# Patient Record
Sex: Male | Born: 1965 | Race: Black or African American | Hispanic: No | Marital: Single | Smoking: Current every day smoker
Health system: Southern US, Community
[De-identification: ages and names within clinical notes are randomized; demographics above are authoritative.]

## PROBLEM LIST (undated history)

## (undated) DIAGNOSIS — T7840XA Allergy, unspecified, initial encounter: Secondary | ICD-10-CM

## (undated) DIAGNOSIS — R42 Dizziness and giddiness: Secondary | ICD-10-CM

## (undated) DIAGNOSIS — I10 Essential (primary) hypertension: Secondary | ICD-10-CM

## (undated) DIAGNOSIS — N400 Enlarged prostate without lower urinary tract symptoms: Secondary | ICD-10-CM

## (undated) HISTORY — DX: Allergy, unspecified, initial encounter: T78.40XA

## (undated) HISTORY — DX: Dizziness and giddiness: R42

---

## 1999-06-30 ENCOUNTER — Emergency Department (HOSPITAL_COMMUNITY): Admission: EM | Admit: 1999-06-30 | Discharge: 1999-06-30 | Payer: Self-pay | Admitting: Emergency Medicine

## 2003-03-10 ENCOUNTER — Emergency Department (HOSPITAL_COMMUNITY): Admission: EM | Admit: 2003-03-10 | Discharge: 2003-03-10 | Payer: Self-pay | Admitting: Emergency Medicine

## 2003-08-16 ENCOUNTER — Emergency Department (HOSPITAL_COMMUNITY): Admission: AD | Admit: 2003-08-16 | Discharge: 2003-08-16 | Payer: Self-pay | Admitting: Family Medicine

## 2004-09-04 ENCOUNTER — Emergency Department (HOSPITAL_COMMUNITY): Admission: EM | Admit: 2004-09-04 | Discharge: 2004-09-05 | Payer: Self-pay | Admitting: Emergency Medicine

## 2005-02-08 ENCOUNTER — Emergency Department (HOSPITAL_COMMUNITY): Admission: EM | Admit: 2005-02-08 | Discharge: 2005-02-08 | Payer: Self-pay | Admitting: Emergency Medicine

## 2006-02-26 ENCOUNTER — Emergency Department (HOSPITAL_COMMUNITY): Admission: EM | Admit: 2006-02-26 | Discharge: 2006-02-26 | Payer: Self-pay | Admitting: *Deleted

## 2006-05-22 ENCOUNTER — Emergency Department (HOSPITAL_COMMUNITY): Admission: EM | Admit: 2006-05-22 | Discharge: 2006-05-22 | Payer: Self-pay | Admitting: Emergency Medicine

## 2006-05-24 ENCOUNTER — Emergency Department (HOSPITAL_COMMUNITY): Admission: EM | Admit: 2006-05-24 | Discharge: 2006-05-24 | Payer: Self-pay | Admitting: Emergency Medicine

## 2009-12-13 ENCOUNTER — Emergency Department (HOSPITAL_COMMUNITY): Admission: EM | Admit: 2009-12-13 | Discharge: 2009-12-13 | Payer: Self-pay | Admitting: Emergency Medicine

## 2012-03-18 ENCOUNTER — Ambulatory Visit: Payer: Self-pay | Admitting: Family Medicine

## 2012-03-18 VITALS — BP 152/90 | HR 91 | Temp 98.2°F | Resp 16 | Ht 74.5 in | Wt 200.4 lb

## 2012-03-18 DIAGNOSIS — J329 Chronic sinusitis, unspecified: Secondary | ICD-10-CM

## 2012-03-18 DIAGNOSIS — I10 Essential (primary) hypertension: Secondary | ICD-10-CM

## 2012-03-18 DIAGNOSIS — K089 Disorder of teeth and supporting structures, unspecified: Secondary | ICD-10-CM | POA: Insufficient documentation

## 2012-03-18 DIAGNOSIS — J0100 Acute maxillary sinusitis, unspecified: Secondary | ICD-10-CM

## 2012-03-18 MED ORDER — FLUTICASONE PROPIONATE 50 MCG/ACT NA SUSP: 2.0000 | Freq: Every day | NASAL | Status: DC

## 2012-03-18 MED ORDER — CHLORTHALIDONE 25 MG PO TABS: 25.0000 mg | ORAL_TABLET | Freq: Every day | ORAL | Status: DC

## 2012-03-18 MED ORDER — CHLORHEXIDINE GLUCONATE 0.12 % MT SOLN: 15.0000 mL | Freq: Two times a day (BID) | OROMUCOSAL | Status: AC

## 2012-03-18 MED ORDER — AMOXICILLIN 875 MG PO TABS: 875.0000 mg | ORAL_TABLET | Freq: Two times a day (BID) | ORAL | Status: AC

## 2012-03-18 MED ORDER — TRAMADOL HCL 50 MG PO TABS: ORAL_TABLET | ORAL | Status: DC

## 2012-03-18 NOTE — Progress Notes (Signed)
  Subjective:    Patient ID: John Woods, male    DOB: 19-Apr-1966, 46 y.o.   MRN: [id number]  HPI  Patient complains of runny nose, pnd and cough since pollen counts have been up. Now with facial and dental pain.  Dental disease; delinquent dental care  Tobacco 1 pack per day  Review of Systems     Objective:   Physical Exam  Constitutional: He appears well-developed.  HENT:       Nares inflamed, purulent discharge Extensive peridontal disease  Neck: Neck supple.  Cardiovascular: Normal rate, regular rhythm and normal heart sounds.   Pulmonary/Chest: Effort normal and breath sounds normal.  Abdominal: Soft. Bowel sounds are normal.  Neurological: He is alert.  Skin: Skin is warm.          Assessment & Plan:   1. Sinusitis  amoxicillin (AMOXIL) 875 MG tablet, fluticasone (FLONASE) 50 MCG/ACT nasal spray  2. Dental disease  chlorhexidine (PERIDEX) 0.12 % solution, traMADol (ULTRAM) 50 MG tablet  3. HTN (hypertension)  chlorthalidone (HYGROTON) 25 MG tablet

## 2012-03-20 ENCOUNTER — Telehealth: Payer: Self-pay

## 2012-03-20 NOTE — Telephone Encounter (Signed)
PT STATES HE WAS GIVEN ABOUT 5 DIFFERENT MEDICINE AND SOME IS MAKING HIM NAUSEA AND ALSO SUPPRESSING HIS APPETITE  PLEASE CALL [phone]

## 2012-03-20 NOTE — Telephone Encounter (Signed)
PT STATES MEDS ARE MAKING HIM DIZZY AND NAUSEATED AND HE HAS SUPPRESSED APPETITE. IS THERE ANYTHING THAT CAN BE DONE?

## 2012-03-20 NOTE — Telephone Encounter (Signed)
Of the meds he received, the tramadol is the most likely to cause dizziness and nausea.  Stop that one first.  The chlorthalidone can also cause dizziness if it brings his BP down more than expected, so he should consider checking his BP to see what it is.  Additionally, his illness can cause nausea and dizziness, so if the symptoms continue, he should RTC for re-evaluation.

## 2012-03-21 NOTE — Telephone Encounter (Signed)
LMOM to call back

## 2012-03-21 NOTE — Telephone Encounter (Signed)
Spoke with patient and let him know that he could stop the tramadol and keep an eye on his blood pressure.  If symptoms continue, rtc.  Patient stated that he understood.

## 2012-05-03 ENCOUNTER — Other Ambulatory Visit: Payer: Self-pay | Admitting: Family Medicine

## 2012-06-09 ENCOUNTER — Other Ambulatory Visit: Payer: Self-pay | Admitting: *Deleted

## 2012-06-09 MED ORDER — CHLORTHALIDONE 25 MG PO TABS: 25.0000 mg | ORAL_TABLET | Freq: Every day | ORAL | Status: DC

## 2012-07-19 ENCOUNTER — Other Ambulatory Visit: Payer: Self-pay

## 2012-07-19 MED ORDER — CHLORTHALIDONE 25 MG PO TABS: 25.0000 mg | ORAL_TABLET | Freq: Every day | ORAL | Status: DC

## 2013-03-12 ENCOUNTER — Ambulatory Visit: Payer: Self-pay | Admitting: Internal Medicine

## 2013-03-12 VITALS — BP 152/98 | HR 79 | Temp 97.9°F | Resp 18 | Wt 181.0 lb

## 2013-03-12 DIAGNOSIS — J301 Allergic rhinitis due to pollen: Secondary | ICD-10-CM

## 2013-03-12 DIAGNOSIS — I10 Essential (primary) hypertension: Secondary | ICD-10-CM

## 2013-03-12 DIAGNOSIS — F17200 Nicotine dependence, unspecified, uncomplicated: Secondary | ICD-10-CM | POA: Insufficient documentation

## 2013-03-12 DIAGNOSIS — J329 Chronic sinusitis, unspecified: Secondary | ICD-10-CM

## 2013-03-12 DIAGNOSIS — J309 Allergic rhinitis, unspecified: Secondary | ICD-10-CM

## 2013-03-12 MED ORDER — FLUTICASONE PROPIONATE 50 MCG/ACT NA SUSP: 2.0000 | Freq: Every day | NASAL | Status: DC

## 2013-03-12 MED ORDER — CETIRIZINE HCL 10 MG PO TABS: 10.0000 mg | ORAL_TABLET | Freq: Every day | ORAL | Status: DC

## 2013-03-12 NOTE — Patient Instructions (Signed)
Hypertension As your heart beats, it forces blood through your arteries. This force is your blood pressure. If the pressure is too high, it is called hypertension (HTN) or high blood pressure. HTN is dangerous because you may have it and not know it. High blood pressure may mean that your heart has to work harder to pump blood. Your arteries may be narrow or stiff. The extra work puts you at risk for heart disease, stroke, and other problems.  Blood pressure consists of two numbers, a higher number over a lower, 110/72, for example. It is stated as "110 over 72." The ideal is below 120 for the top number (systolic) and under 80 for the bottom (diastolic). Write down your blood pressure today. You should pay close attention to your blood pressure if you have certain conditions such as:  Heart failure.  Prior heart attack.  Diabetes  Chronic kidney disease.  Prior stroke.  Multiple risk factors for heart disease. To see if you have HTN, your blood pressure should be measured while you are seated with your arm held at the level of the heart. It should be measured at least twice. A one-time elevated blood pressure reading (especially in the Emergency Department) does not mean that you need treatment. There may be conditions in which the blood pressure is different between your right and left arms. It is important to see your caregiver soon for a recheck. Most people have essential hypertension which means that there is not a specific cause. This type of high blood pressure may be lowered by changing lifestyle factors such as:  Stress.  Smoking.  Lack of exercise.  Excessive weight.  Drug/tobacco/alcohol use.  Eating less salt. Most people do not have symptoms from high blood pressure until it has caused damage to the body. Effective treatment can often prevent, delay or reduce that damage. TREATMENT  When a cause has been identified, treatment for high blood pressure is directed at the  cause. There are a large number of medications to treat HTN. These fall into several categories, and your caregiver will help you select the medicines that are best for you. Medications may have side effects. You should review side effects with your caregiver. If your blood pressure stays high after you have made lifestyle changes or started on medicines,   Your medication(s) may need to be changed.  Other problems may need to be addressed.  Be certain you understand your prescriptions, and know how and when to take your medicine.  Be sure to follow up with your caregiver within the time frame advised (usually within two weeks) to have your blood pressure rechecked and to review your medications.  If you are taking more than one medicine to lower your blood pressure, make sure you know how and at what times they should be taken. Taking two medicines at the same time can result in blood pressure that is too low. SEEK IMMEDIATE MEDICAL CARE IF:  You develop a severe headache, blurred or changing vision, or confusion.  You have unusual weakness or numbness, or a faint feeling.  You have severe chest or abdominal pain, vomiting, or breathing problems. MAKE SURE YOU:   Understand these instructions.  Will watch your condition.  Will get help right away if you are not doing well or get worse. Document Released: 10/10/2005 Document Revised: 01/02/2012 Document Reviewed: 05/30/2008 [employer] Patient Information 2013 [employer]. DASH Diet The DASH diet stands for "Dietary Approaches to Stop Hypertension." It is a healthy   eating plan that has been shown to reduce high blood pressure (hypertension) in as little as 14 days, while also possibly providing other significant health benefits. These other health benefits include reducing the risk of breast cancer after menopause and reducing the risk of type 2 diabetes, heart disease, colon cancer, and stroke. Health benefits also include weight loss  and slowing kidney failure in patients with chronic kidney disease.  DIET GUIDELINES  Limit salt (sodium). Your diet should contain less than 1500 mg of sodium daily.  Limit refined or processed carbohydrates. Your diet should include mostly whole grains. Desserts and added sugars should be used sparingly.  Include small amounts of heart-healthy fats. These types of fats include nuts, oils, and tub margarine. Limit saturated and trans fats. These fats have been shown to be harmful in the body. CHOOSING FOODS  The following food groups are based on a 2000 calorie diet. See your Registered Dietitian for individual calorie needs. Grains and Grain Products (6 to 8 servings daily)  Eat More Often: Whole-wheat bread, brown rice, whole-grain or wheat pasta, quinoa, popcorn without added fat or salt (air popped).  Eat Less Often: White bread, white pasta, white rice, cornbread. Vegetables (4 to 5 servings daily)  Eat More Often: Fresh, frozen, and canned vegetables. Vegetables may be raw, steamed, roasted, or grilled with a minimal amount of fat.  Eat Less Often/Avoid: Creamed or fried vegetables. Vegetables in a cheese sauce. Fruit (4 to 5 servings daily)  Eat More Often: All fresh, canned (in natural juice), or frozen fruits. Dried fruits without added sugar. One hundred percent fruit juice ( cup [237 mL] daily).  Eat Less Often: Dried fruits with added sugar. Canned fruit in light or heavy syrup. [employer], Fish, and Poultry (2 servings or less daily. One serving is 3 to 4 oz [85-114 g]).  Eat More Often: Ninety percent or leaner ground beef, tenderloin, sirloin. Round cuts of beef, chicken breast, [place] breast. All fish. Grill, bake, or broil your meat. Nothing should be fried.  Eat Less Often/Avoid: Fatty cuts of meat, [place], or chicken leg, thigh, or wing. Fried cuts of meat or fish. Dairy (2 to 3 servings)  Eat More Often: Low-fat or fat-free milk, low-fat plain or light yogurt,  reduced-fat or part-skim cheese.  Eat Less Often/Avoid: Milk (whole, 2%).Whole milk yogurt. Full-fat cheeses. Nuts, Seeds, and Legumes (4 to 5 servings per week)  Eat More Often: All without added salt.  Eat Less Often/Avoid: Salted nuts and seeds, canned beans with added salt. Fats and Sweets (limited)  Eat More Often: Vegetable oils, tub margarines without trans fats, sugar-free gelatin. Mayonnaise and salad dressings.  Eat Less Often/Avoid: Coconut oils, palm oils, butter, stick margarine, cream, half and half, cookies, candy, pie. FOR MORE INFORMATION The Dash Diet Eating Plan: www.dashdiet.org Document Released: 09/29/2011 Document Revised: 01/02/2012 Document Reviewed: 09/29/2011 [facility] Patient Information 2013 [place], [place]. Nicotine chewing gum What is this medicine? NICOTINE (NIK oh teen) helps people stop smoking. This medicine replaces the nicotine found in cigarettes and helps to decrease withdrawal effects. It is most effective when used in combination with a stop-smoking program. This medicine may be used for other purposes; ask your health care provider or pharmacist if you have questions. What should I tell my health care provider before I take this medicine? They need to know if you have any of these conditions: -diabetes -heart disease, angina, irregular heartbeat or previous heart attack -lung disease, including asthma -overactive thyroid -pheochromocytoma -stomach problems  or ulcers -an unusual or allergic reaction to nicotine, other medicines, foods, dyes, or preservatives -pregnant or trying to get pregnant -breast-feeding How should I use this medicine? Chew but do not swallow the gum. Follow the directions that come with the chewing gum. Use exactly as directed. When you feel an urgent desire for a cigarette, chew one piece of gum slowly. Continue chewing until you taste the gum or feel a slight tingling in your mouth. Then, stop chewing and place the gum  between your cheek and gum. Wait until the taste or tingling is almost gone then start chewing again. Continue chewing in this manner for about 30 minutes. Slow chewing helps reduce cravings and also helps reduce the chance for heartburn or other gastrointestinal side effects. Talk to your pediatrician regarding the use of this medicine in children. Special care may be needed. Overdosage: If you think you have taken too much of this medicine contact a poison control center or emergency room at once. NOTE: This medicine is only for you. Do not share this medicine with others. What if I miss a dose? This does not apply. Only use the chewing gum when you have a strong desire to smoke. Do not use more than one piece of gum at a time. What may interact with this medicine? -medicines for asthma -medicines for blood pressure -medicines for mental depression This list may not describe all possible interactions. Give your health care provider a list of all the medicines, herbs, non-prescription drugs, or dietary supplements you use. Also tell them if you smoke, drink alcohol, or use illegal drugs. Some items may interact with your medicine. What should I watch for while using this medicine? Always carry the nicotine gum with you. Do not smoke while you are using the chewing gum. Do not use more than 30 pieces of gum a day. Too much gum can increase the risk of an overdose. As the urge to smoke gets less, gradually reduce the number of pieces each day over a period of 2 to 3 months. When you are only using 1 or 2 pieces a day, stop using the nicotine gum. If your mouth gets sore from chewing the gum, suck hard sugarless candy between pieces of gum to help relieve the soreness. Brush your teeth regularly to reduce mouth irritation. If you wear dentures, contact your doctor or health care professional if the gum sticks to your dental work. If you are a diabetic and you quit smoking, the effects of insulin may be  increased and you may need to reduce your insulin dose. Check with your doctor or health care professional about how you should adjust your insulin dose. What side effects may I notice from receiving this medicine? Side effects that you should report to your doctor or health care professional as soon as possible: -allergic reactions like skin rash, itching or hives, swelling of the face, lips, or tongue -blisters in mouth -breathing problems -changes in hearing -changes in vision -chest pain -cold sweats -confusion -fast, irregular heartbeat -feeling faint or lightheaded, falls -headache -increased saliva -nausea, vomiting -stomach pain -weakness Side effects that usually do not require medical attention (report to your doctor or health care professional if they continue or are bothersome): -diarrhea -dry mouth -hiccups -irritability -nervousness or restlessness -trouble sleeping or vivid dreams This list may not describe all possible side effects. Call your doctor for medical advice about side effects. You may report side effects to FDA at 1-800-FDA-1088. Where should I keep  my medicine? Keep out of the reach of children. Store at room temperature between 15 and 30 degrees C (59 and 86 degrees F). Protect from heat and light. Throw away unused medicine after the expiration date. NOTE: This sheet is a summary. It may not cover all possible information. If you have questions about this medicine, talk to your doctor, pharmacist, or health care provider.  2012, Elsevier/Gold Standard. (12/14/2010 1:00:52 PM)Nicotine Addiction Nicotine can act as both a stimulant (excites/activates) and a sedative (calms/quiets). Immediately after exposure to nicotine, there is a "kick" caused in part by the drug's stimulation of the adrenal glands and resulting discharge of adrenaline (epinephrine). The rush of adrenaline stimulates the body and causes a sudden release of sugar. This means that smokers  are always slightly hyperglycemic. Hyperglycemic means that the blood sugar is high, just like in diabetics. Nicotine also decreases the amount of insulin which helps control sugar levels in the body. There is an increase in blood pressure, breathing, and the rate of heart beats.  In addition, nicotine indirectly causes a release of dopamine in the brain that controls pleasure and motivation. A similar reaction is seen with other drugs of abuse, such as cocaine and heroin. This dopamine release is thought to cause the pleasurable sensations when smoking. In some different cases, nicotine can also create a calming effect, depending on sensitivity of the smoker's nervous system and the dose of nicotine taken. WHAT HAPPENS WHEN NICOTINE IS TAKEN FOR LONG PERIODS OF TIME?  Long-term use of nicotine results in addiction. It is difficult to stop.  Repeated use of nicotine creates tolerance. Higher doses of nicotine are needed to get the "kick." When nicotine use is stopped, withdrawal may last a month or more. Withdrawal may begin within a few hours after the last cigarette. Symptoms peak within the first few days and may lessen within a few weeks. For some people, however, symptoms may last for months or longer. Withdrawal symptoms include:   Irritability.  Craving.  Learning and attention deficits.  Sleep disturbances.  Increased appetite. Craving for tobacco may last for 6 months or longer. Many behaviors done while using nicotine can also play a part in the severity of withdrawal symptoms. For some people, the feel, smell, and sight of a cigarette and the ritual of obtaining, handling, lighting, and smoking the cigarette are closely linked with the pleasure of smoking. When stopped, they also miss the related behaviors which make the withdrawal or craving worse. While nicotine gum and patches may lessen the drug aspects of withdrawal, cravings often persist. WHAT ARE THE MEDICAL CONSEQUENCES OF  NICOTINE USE?  Nicotine addiction accounts for one-third of all cancers. The top cancer caused by tobacco is lung cancer. Lung cancer is the number one cancer killer of both men and women.  Smoking is also associated with cancers of the:  Mouth.  Pharynx.  Larynx.  Esophagus.  Stomach.  Pancreas.  Cervix.  Kidney.  Ureter.  Bladder.  Smoking also causes lung diseases such as lasting (chronic) bronchitis and emphysema.  It worsens asthma in adults and children.  Smoking increases the risk of heart disease, including:  Stroke.  Heart attack.  Vascular disease.  Aneurysm.  Passive or secondary smoke can also increase medical risks including:  Asthma in children.  Sudden Infant Death Syndrome (SIDS).  Additionally, dropped cigarettes are the leading cause of residential fire fatalities.  Nicotine poisoning has been reported from accidental ingestion of tobacco products by children and pets. Death  usually results in a few minutes from respiratory failure (when a person stops breathing) caused by paralysis. TREATMENT   Medication. Nicotine replacement medicines such as nicotine gum and the patch are used to stop smoking. These medicines gradually lower the dosage of nicotine in the body. These medicines do not contain the carbon monoxide and other toxins found in tobacco smoke.  Hypnotherapy.  Relaxation therapy.  Nicotine Anonymous (a 12-step support program). Find times and locations in your local yellow pages. Document Released: 06/15/2004 Document Revised: 01/02/2012 Document Reviewed: 11/07/2007 [facility] Patient Information 2013 [place], [place].

## 2013-03-12 NOTE — Progress Notes (Signed)
  Subjective:    Patient ID: John Woods, male    DOB: 01-06-1966, 47 y.o.   MRN: [id number]  HPI Has chronic congestion year round, has probable allergys, but has not tried fluticasone yet.  Continues to smoke! Must quit , offered options. HTN may be an issue but unclear with smoking and otc sinus meds.   Review of Systems     Objective:   Physical Exam  Vitals reviewed. Constitutional: He is oriented to person, place, and time. He appears well-developed and well-nourished.  HENT:  Right Ear: External ear normal.  Left Ear: External ear normal.  Nose: Mucosal edema and rhinorrhea present. No sinus tenderness. No epistaxis.  Mouth/Throat: Oropharynx is clear and moist.  Eyes: EOM are normal.  Cardiovascular: Normal rate, regular rhythm and normal heart sounds.   Pulmonary/Chest: Effort normal and breath sounds normal. He has no wheezes. He exhibits no tenderness.  Neurological: He is alert and oriented to person, place, and time. He has normal reflexes. He exhibits normal muscle tone. Coordination normal.  Psychiatric: He has a normal mood and affect.   BP 130/86       Assessment & Plan:  Hayfever/Nicotine abuse/BP elevation/Counseling Record home BP/Quit smoking--nicorrete Life style change

## 2013-10-30 ENCOUNTER — Ambulatory Visit: Payer: Self-pay | Admitting: Family Medicine

## 2013-10-30 VITALS — BP 118/86 | HR 68 | Temp 98.1°F | Resp 16 | Ht 75.0 in | Wt 184.0 lb

## 2013-10-30 DIAGNOSIS — H109 Unspecified conjunctivitis: Secondary | ICD-10-CM

## 2013-10-30 MED ORDER — PREDNISOLONE ACETATE 1 % OP SUSP: 1.0000 [drp] | Freq: Two times a day (BID) | OPHTHALMIC | Status: DC

## 2013-10-30 MED ORDER — TOBRAMYCIN 0.3 % OP SOLN: 1.0000 [drp] | Freq: Two times a day (BID) | OPHTHALMIC | Status: DC

## 2013-10-30 NOTE — Progress Notes (Signed)
Subjective:  This chart was scribed for Elvina SidleKurt Lauenstein, MD by Carl Bestelina Holson, Medical Scribe. This patient was seen in Room 9 and the patient's care was started at 5:36 PM.   Patient ID: John AmsterdamLee A Quant, male    DOB: 11/27/1965, 48 y.o.   MRN: [id number]  HPI HPI Comments: John Woods is a 48 y.o. male who presents to the [facility] complaining of constant, worsening eye and sinus drainage.  He states that the eye drainage started about a month ago but it has been worsening over the past two days.  He states that the sinus drainage started a week ago.  He lists blurred vision and intermittent right eye pain as associated symptoms.  He states that closing his eye improves his vision.  He states that he has applied eye drops to his right eye with no relief in his symptoms.  He states that the grease at work causes eye irritation.  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  Past Medical History  Diagnosis Date   Allergy    History reviewed. No pertinent past surgical history. Family History  Problem Relation Age of Onset   Cancer Mother    Cancer Father    History   Social History   Marital Status: Single    Spouse Name: N/A    Number of Children: N/A   Years of Education: N/A   Occupational History   Not on file.   Social History Main Topics   Smoking status: Current Every Day Smoker   Smokeless tobacco: Not on file   Alcohol Use: No   Drug Use: No   Sexual Activity: Yes   Other Topics Concern   Not on file   Social History Narrative   No narrative on file   No Known Allergies  Review of Systems  Eyes: Positive for pain (right eye), discharge and visual disturbance.  All other systems reviewed and are negative.     Objective:  Physical Exam  Nursing note and vitals reviewed. Constitutional: He is oriented to person, place, and time. He appears well-developed and well-nourished.  HENT:  Head: Normocephalic and  atraumatic.  Eyes: EOM are normal. Pupils are equal, round, and reactive to light.  Neck: Normal range of motion and phonation normal.  Cardiovascular: Normal rate.   Pulmonary/Chest: Effort normal. He exhibits no bony tenderness.  Abdominal: Soft. Normal appearance.  Musculoskeletal: Normal range of motion.  Neurological: He is alert and oriented to person, place, and time. No cranial nerve deficit or sensory deficit. He exhibits normal muscle tone. Coordination normal.  Skin: Skin is warm, dry and intact.  Psychiatric: He has a normal mood and affect. His behavior is normal. Judgment and thought content normal.     BP 118/86   Pulse 68   Temp(Src) 98.1 F (36.7 C)   Resp 16   Ht 6\' 3"  (1.905 m)   Wt 184 lb (83.462 kg)   BMI 23.00 kg/m2   SpO2 99% Assessment & Plan:   Meds ordered this encounter  Medications   tobramycin (TOBREX) 0.3 % ophthalmic solution    Sig: Place 1 drop into the right eye every 12 (twelve) hours.    Dispense:  5 mL    Refill:  0   prednisoLONE acetate (PRED FORTE) 1 % ophthalmic suspension    Sig: Place 1 drop into the right eye 2 (two) times daily.    Dispense:  5 mL  Refill:  0     1. Conjunctivitis     I personally performed the services described in this documentation, which was scribed in my presence. The recorded information has been reviewed and is accurate.  Elvina Sidle, MD

## 2014-01-09 ENCOUNTER — Telehealth: Payer: Self-pay | Admitting: *Deleted

## 2014-01-09 NOTE — Telephone Encounter (Signed)
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 Per Dr. Milus GlazierLauenstein patient needs a office visit in order to have this filled out. AC

## 2015-12-25 ENCOUNTER — Encounter (HOSPITAL_COMMUNITY): Payer: Self-pay

## 2015-12-25 ENCOUNTER — Emergency Department (HOSPITAL_COMMUNITY)
Admission: EM | Admit: 2015-12-25 | Discharge: 2015-12-25 | Disposition: A | Discharge status: Home / Self Care | Payer: Self-pay | Attending: Emergency Medicine | Admitting: Emergency Medicine

## 2015-12-25 DIAGNOSIS — F17200 Nicotine dependence, unspecified, uncomplicated: Secondary | ICD-10-CM | POA: Insufficient documentation

## 2015-12-25 DIAGNOSIS — Z23 Encounter for immunization: Secondary | ICD-10-CM | POA: Insufficient documentation

## 2015-12-25 DIAGNOSIS — Z7952 Long term (current) use of systemic steroids: Secondary | ICD-10-CM | POA: Insufficient documentation

## 2015-12-25 DIAGNOSIS — L02811 Cutaneous abscess of head [any part, except face]: Secondary | ICD-10-CM | POA: Insufficient documentation

## 2015-12-25 DIAGNOSIS — L0291 Cutaneous abscess, unspecified: Secondary | ICD-10-CM

## 2015-12-25 DIAGNOSIS — Z792 Long term (current) use of antibiotics: Secondary | ICD-10-CM | POA: Insufficient documentation

## 2015-12-25 MED ORDER — CEPHALEXIN 500 MG PO CAPS: 500.0000 mg | ORAL_CAPSULE | Freq: Four times a day (QID) | ORAL | Status: DC

## 2015-12-25 MED ORDER — TETANUS-DIPHTH-ACELL PERTUSSIS 5-2.5-18.5 LF-MCG/0.5 IM SUSP
0.5000 mL | Freq: Once | INTRAMUSCULAR | Status: AC
  Administered 2015-12-25: 0.5 mL via INTRAMUSCULAR
  Filled 2015-12-25: qty 0.5

## 2015-12-25 MED ORDER — LIDOCAINE-EPINEPHRINE (PF) 2 %-1:200000 IJ SOLN: 10.0000 mL | Freq: Once | INTRAMUSCULAR | Status: DC

## 2015-12-25 MED ORDER — IBUPROFEN 800 MG PO TABS: 800.0000 mg | ORAL_TABLET | Freq: Three times a day (TID) | ORAL | Status: DC

## 2015-12-25 MED ORDER — LIDOCAINE-EPINEPHRINE 2 %-1:100000 IJ SOLN
INTRAMUSCULAR | Status: AC
  Filled 2015-12-25: qty 1

## 2015-12-25 NOTE — ED Provider Notes (Signed)
CSN: [id number]     Arrival date & time 12/25/15  1652 History  By signing my name below, I, Emmanuella Mensah, attest that this documentation has been prepared under the direction and in the presence of Shawn Joy, PA-C. Electronically Signed: Angelene GiovanniEmmanuella Mensah, ED Scribe. 12/25/2015. 5:37 PM.   Chief Complaint  Patient presents with  . Recurrent Skin Infections   The history is provided by the patient. No language interpreter was used.   HPI Comments: Abbe AmsterdamLee A Si is a 50 y.o. male with a hx of allergies who presents to the Emergency Department complaining of gradually worsening 8/10 throbbing area of swelling and redness to his posterior head the size of a ping pong ball onset several years ago, but growing and worsening the last few days. He compares his pain to a tooth ache. No alleviating factors noted. Pt has not tried any medications PTA for these symptoms. He denies any fever, chills, HA, dizziness, N/V, neuro deficits, or any other complaints.    Past Medical History  Diagnosis Date  . Allergy    History reviewed. No pertinent past surgical history. Family History  Problem Relation Age of Onset  . Cancer Mother   . Cancer Father    Social History  Substance Use Topics  . Smoking status: Current Every Day Smoker  . Smokeless tobacco: None  . Alcohol Use: No    Review of Systems  Constitutional: Negative for fever and chills.  Gastrointestinal: Negative for nausea and vomiting.  Skin:       Area of swelling and redness to his posterior head   Neurological: Negative for dizziness and headaches.      Allergies  Review of patient's allergies indicates no known allergies.  Home Medications   Prior to Admission medications   Medication Sig Start Date End Date Taking? Authorizing Provider  cephALEXin (KEFLEX) 500 MG capsule Take 1 capsule (500 mg total) by mouth 4 (four) times daily. 12/25/15   Shawn C Joy, PA-C  ibuprofen (ADVIL,MOTRIN) 800 MG tablet Take 1 tablet (800 mg  total) by mouth 3 (three) times daily. 12/25/15   Shawn C Joy, PA-C  prednisoLONE acetate (PRED FORTE) 1 % ophthalmic suspension Place 1 drop into the right eye 2 (two) times daily. 10/30/13   Elvina SidleKurt Lauenstein, MD  tobramycin (TOBREX) 0.3 % ophthalmic solution Place 1 drop into the right eye every 12 (twelve) hours. 10/30/13   Elvina SidleKurt Lauenstein, MD   BP 170/96 mmHg  Pulse 75  Temp(Src) 98.2 F (36.8 C) (Oral)  Resp 20  SpO2 100% Physical Exam  Constitutional: He is oriented to person, place, and time. He appears well-developed and well-nourished.  HENT:  Head: Normocephalic and atraumatic.  Eyes: Conjunctivae and EOM are normal. Pupils are equal, round, and reactive to light.  Neck: Normal range of motion. Neck supple.  Cardiovascular: Normal rate.   Pulmonary/Chest: Effort normal.  Abdominal: He exhibits no distension.  Musculoskeletal:  Full ROM in all extremities and spine. No paraspinal tenderness.   Lymphadenopathy:       Head (right side): No preauricular, no posterior auricular and no occipital adenopathy present.       Head (left side): No preauricular, no posterior auricular and no occipital adenopathy present.    He has no cervical adenopathy.  Neurological: He is alert and oriented to person, place, and time. He has normal reflexes.  No sensory deficits. Strength 5/5 in all extremities. No gait disturbance. Coordination intact. Cranial nerves III-XII grossly intact. No facial droop.  Skin: Skin is warm and dry. There is erythema.  Area of swelling, erythema, and fluctuance in central occipital region, the size of ping pong ball.   Psychiatric: He has a normal mood and affect.  Nursing note and vitals reviewed.   ED Course  .Marland KitchenIncision and Drainage Date/Time: 12/25/2015 5:46 PM Performed by: Anselm Pancoast Authorized by: Harolyn Rutherford C Consent: Verbal consent obtained. Risks and benefits: risks, benefits and alternatives were discussed Consent given by: patient Patient  understanding: patient states understanding of the procedure being performed Patient consent: the patient's understanding of the procedure matches consent given Procedure consent: procedure consent matches procedure scheduled Patient identity confirmed: verbally with patient and arm band Time out: Immediately prior to procedure a "time out" was called to verify the correct patient, procedure, equipment, support staff and site/side marked as required. Type: abscess Body area: head Location details: scalp Anesthesia: local infiltration Local anesthetic: lidocaine 2% with epinephrine Anesthetic total: 2 ml Patient sedated: no Scalpel size: 11 Incision type: single straight Incision depth: subcutaneous Complexity: simple Drainage: purulent and  bloody Drainage amount: copious Wound treatment: wound left open Packing material: none Patient tolerance: Patient tolerated the procedure well with no immediate complications Comments: Copious, foul-smelling discharge. Skull appears stable underneath.   (including critical care time) DIAGNOSTIC STUDIES: Oxygen Saturation is 100% on RA, normal by my interpretation.    COORDINATION OF CARE: 5:36 PM- Pt advised of plan for treatment and pt agrees. Pt will receive I&D with local anesthetic.  EMERGENCY DEPARTMENT US SOFT TISSUE INTERPRETATION "Study: Limited Ultrasound of the noted body part in comments below"  INDICATIONS: Pain and Soft tissue infection Multiple views of the body part are obtained with a multi-frequency linear probe  PERFORMED BY:  Myself  IMAGES ARCHIVED?: Yes  SIDE:Midline  BODY PART:Other soft tisse (comment in note)  FINDINGS: Abscess identified with no cellulitis.   LIMITATIONS: None  INTERPRETATION:  Abcess present and No cellulitis noted  COMMENT:  Body part: central occipital region.    Harolyn Rutherford, PA-C has personally reviewed and evaluated these images as part of his medical decision-making.   MDM    Final diagnoses:  Abscess    Lilia Pro Cuthbertson presents with an abscess to the back of his head worsening in the last few days.  Ultrasound showed area of drainable fluid collection. I&D was successful. No signs of cellulitis. Antibiotic and pain management prescriptions given. Home care and return precautions discussed. Patient voiced understanding of these instructions and is comfortable with discharge. Patient appears safe for discharge at this time.  I personally performed the services described in this documentation, which was scribed in my presence. The recorded information has been reviewed and is accurate.   Anselm Pancoast, PA-C 12/25/15 1807  Melene Plan, DO 12/25/15 2247

## 2015-12-25 NOTE — ED Notes (Signed)
Pt reports he has a cyst on the back of his head that he has noticed several years ago, has not been getting any better, would like to get it checked out.

## 2015-12-25 NOTE — Discharge Instructions (Signed)
You have been seen today for abscess. Return to the ED or go to the PCP office in 3 days for a wound check to assure proper healing. Return to ED should symptoms worsen.  RESOURCE GUIDE  Chronic Pain Problems: Contact [facility]  [phone] Patients need to be referred by their primary care doctor.  Insufficient Money for Medicine: Contact United Way:  call "211" or [facility] [phone].  No Primary Care Doctor: - Call [facility]  [phone] - can help you locate a primary care doctor that  accepts your insurance, provides certain services, etc. - Physician Referral Service- [phone]  Agencies that provide inexpensive medical care: - [facility]  [phone] - [facility]  [phone] - [facility]  [phone] - Women's Clinic  [phone] - Planned Parenthood  [phone] Haynes Bast[facility]  [phone]  Medicaid-accepting [facility] Providers: - [facility]- [address], [address]  [phone], Mon-Fri 9am-7pm, Sat 9am-1pm - [facility]- [address], [address] [place]  [phone] - [facility]- [address], [address] [place]  [phone] [facility]- [facility]- [address]  [phone] - Renaye RakersVeita Bland- [address], [address], [phone]  Only accepts [place] patients after they have their name  applied to their card  Self Pay (no insurance) in [place]: - Sickle Cell Patients: Dr Willey BladeEric Dean, [facility]  [address], [phone] - [facility]- [address]  [phone]       Redge Gainer-     [facility] [place]- [address], [address]       -     [facility]- see information above (Speak to CitigroupPam H if you do not have insurance)       -  [facility]- [address], [phone]       -  [facility]- [address],  [phone]        -  [facility]- [address], [phone]       -  Dr Julio Sickssei-Bonsu-  [address], [address], [place], [phone]       -  [facility]- [address], [phone]       -  [facility]- [address], [phone], also [address], [phone]       -    [facility]- [address], [phone], 1st & 3rd Saturday   every month, 10am-1pm  1) Find a Doctor and Pay Out of Pocket Although you won't have to find out who is covered by your insurance plan, it is a good idea to ask around and get recommendations. You will then need to call the office and see if the doctor you have chosen will accept you as a new patient and what types of options they offer for patients who are self-pay. Some doctors offer discounts or will set up payment plans for their patients who do not have insurance, but you will need to ask so you aren't surprised when you get to your appointment.  2) Contact Your Local Health Department Not all health departments have doctors that can see patients for sick visits, but many do, so it is worth a call to see if yours does. If you  don't know where your local health department is, you can check in your phone book. The CDC also has a tool to help you locate your state's health department, and many state websites also have listings of all of their local health departments.  3) Find a Walk-in Clinic If your illness is not likely to be very severe or complicated, you may want to try a walk in clinic. These are popping up all over the country in pharmacies, drugstores, and shopping centers. They're usually staffed by nurse practitioners or physician assistants that have been trained to treat common illnesses and complaints. They're usually fairly quick and inexpensive. However, if you have serious medical issues or chronic medical problems, these are probably not your best option  STD Testing - [facility] Department of  [facility] [place], STD Clinic, [address], [place], phone [phone] or [phone].  Monday - Friday, call for an appointment. [facility] Department of [employer], STD Clinic, [address], [place], phone [phone] or [phone].  Monday - Friday, call for an appointment.  Abuse/Neglect: [facility] Child Abuse Hotline [phone] [facility] Child Abuse Hotline (929)761-9515 (After Hours)  Emergency Shelter:  [facility] 910-273-7835  Maternity Homes: - [facility] at the [facility] [facility] [phone] - [facility] 2540245641  MRSA Hotline #:   (514)641-6353  [facility] Resources  [facility]  United [employer] [facility] Dept. [address]                 [address]         [address] [place] [address]  Blondell Reveal Phone:  [phone]                                  Phone:  [phone]                   Phone:  [phone]  [facility] Mental [facility], [phone] - [phone]       -     [facility] in [place], [address],                                  70[address], [facility] Child Abuse Hotline 757-589-5349 or [phone] (After Hours)   Behavioral Health Services  Substance Abuse Resources: - [facility] Drug [facility]  [phone] - [facility] [phone] - [facility] [phone] Floydene Flock [phone] - Residential & Outpatient Substance Abuse Program  [phone]  Psychological Services: [facility]  [phone] Services  [phone] - [facility], [phone] [place]. [address], [place], ACCESS LINE: [phone] or [phone],  [employer]ur[employer]an.co.za  Dental Assistance  If unable to pay or uninsured, contact:  [facility]  or [facility]. to become qualified for the adult dental clinic.  Patients with Medicaid: [facility] [phone] [address], [phone] [address] [phone]  If unable to pay, or uninsured, contact [facility] [phone]) or [facility] Department [phone] in [place], [phone] in [facility]) to become qualified for the adult dental clinic   Other Low-Cost Community Dental Services: - [facility]- [address], [place], [place], [address], [phone], Ext. [phone], 2nd and 4th Thursday of the month at 6:30am.  10 clients each day by appointment, can sometimes see walk-in patients if someone does not show for an appointment. [facility]- [address] [place], [place], [address], [phone] - [facility]- [address], [place], [place], [address], [phone] - [place] [facility]- [phone] [facility] Department- [phone] [facility] Department- [phone]

## 2016-12-22 ENCOUNTER — Emergency Department (HOSPITAL_COMMUNITY)
Admission: EM | Admit: 2016-12-22 | Discharge: 2016-12-22 | Disposition: A | Discharge status: Home / Self Care | Payer: Self-pay | Attending: Emergency Medicine | Admitting: Emergency Medicine

## 2016-12-22 ENCOUNTER — Emergency Department (HOSPITAL_COMMUNITY): Payer: Self-pay

## 2016-12-22 ENCOUNTER — Encounter (HOSPITAL_COMMUNITY): Payer: Self-pay | Admitting: Emergency Medicine

## 2016-12-22 DIAGNOSIS — N400 Enlarged prostate without lower urinary tract symptoms: Secondary | ICD-10-CM | POA: Insufficient documentation

## 2016-12-22 DIAGNOSIS — F17200 Nicotine dependence, unspecified, uncomplicated: Secondary | ICD-10-CM | POA: Insufficient documentation

## 2016-12-22 DIAGNOSIS — R109 Unspecified abdominal pain: Secondary | ICD-10-CM

## 2016-12-22 DIAGNOSIS — I10 Essential (primary) hypertension: Secondary | ICD-10-CM | POA: Insufficient documentation

## 2016-12-22 DIAGNOSIS — Z79899 Other long term (current) drug therapy: Secondary | ICD-10-CM | POA: Insufficient documentation

## 2016-12-22 LAB — I-STAT CHEM 8, ED
BUN: 7 mg/dL (ref 6–20)
CALCIUM ION: 1.15 mmol/L (ref 1.15–1.40)
Chloride: 102 mmol/L (ref 101–111)
Creatinine, Ser: 0.8 mg/dL (ref 0.61–1.24)
GLUCOSE: 92 mg/dL (ref 65–99)
HCT: 44 % (ref 39.0–52.0)
HEMOGLOBIN: 15 g/dL (ref 13.0–17.0)
Potassium: 4 mmol/L (ref 3.5–5.1)
Sodium: 143 mmol/L (ref 135–145)
TCO2: 30 mmol/L (ref 0–100)

## 2016-12-22 LAB — URINALYSIS, ROUTINE W REFLEX MICROSCOPIC
Bilirubin Urine: NEGATIVE
GLUCOSE, UA: NEGATIVE mg/dL
HGB URINE DIPSTICK: NEGATIVE
KETONES UR: NEGATIVE mg/dL
Leukocytes, UA: NEGATIVE
Nitrite: NEGATIVE
PROTEIN: NEGATIVE mg/dL
Specific Gravity, Urine: 1.004 — ABNORMAL LOW (ref 1.005–1.030)
pH: 7 (ref 5.0–8.0)

## 2016-12-22 MED ORDER — OXYCODONE-ACETAMINOPHEN 5-325 MG PO TABS
1.0000 | ORAL_TABLET | Freq: Once | ORAL | Status: AC
  Administered 2016-12-22: 1 via ORAL
  Filled 2016-12-22: qty 1

## 2016-12-22 MED ORDER — IBUPROFEN 600 MG PO TABS: 600.0000 mg | ORAL_TABLET | Freq: Three times a day (TID) | ORAL | 0 refills | Status: DC | PRN

## 2016-12-22 MED ORDER — CIPROFLOXACIN HCL 500 MG PO TABS: 500.0000 mg | ORAL_TABLET | Freq: Two times a day (BID) | ORAL | 0 refills | Status: DC

## 2016-12-22 NOTE — Discharge Instructions (Signed)
Please call the urology team for follow up

## 2016-12-22 NOTE — ED Triage Notes (Signed)
Pt c/o right flank discomfort onset yesterday at 2300, radiating to RLQ. No urinary symptoms, fever, nausea. No CVAT.

## 2016-12-22 NOTE — ED Provider Notes (Signed)
[facility] Provider Note   CSN: [id number] Arrival date & time: 12/22/16  1110     History   Chief Complaint No chief complaint on file.   HPI John Woods is a 51 y.o. male.  HPI Patient presents with increasing right flank discomfort since yesterday night.  He reports some radiation to the right lower quadrant.  He does have some urinary hesitancy and poor stream.  He denies dysuria.  No fever.  Denies nausea or vomiting.  No history kidney stones.  He reports she's had urinary stream issues for some time now.  Symptoms are moderate in severity.  No other complaints.   Past Medical History:  Diagnosis Date  . Allergy     Patient Active Problem List   Diagnosis Date Noted  . 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 Nicotine dependence 03/12/2013  . HTN (hypertension) 03/18/2012  . Dental disease 03/18/2012    History reviewed. No pertinent surgical history.     Home Medications    Prior to Admission medications   Medication Sig Start Date End Date Taking? Authorizing Provider  OVER THE COUNTER MEDICATION Take 45 mLs by mouth daily. Olive leaf oil    Yes Historical Provider, MD  ciprofloxacin (CIPRO) 500 MG tablet Take 1 tablet (500 mg total) by mouth 2 (two) times daily. 12/22/16   Azalia BilisKevin Nikolay Demetriou, MD  ibuprofen (ADVIL,MOTRIN) 600 MG tablet Take 1 tablet (600 mg total) by mouth every 8 (eight) hours as needed. 12/22/16   Azalia BilisKevin Mellanie Bejarano, MD    Family History Family History  Problem Relation Age of Onset  . Cancer Mother   . Cancer Father     Social History Social History  Substance Use Topics  . Smoking status: Current Every Day Smoker  . Smokeless tobacco: Not on file  . Alcohol use No     Allergies   Patient has no known allergies.   Review of Systems Review of Systems  All other systems reviewed and are negative.    Physical Exam Updated Vital Signs BP 167/94 (BP Location: Right Arm)   Pulse (!) 56   Temp 98.4 F (36.9 C) (Oral)   Resp 16   SpO2 98%    Physical Exam  Constitutional: He is oriented to person, place, and time. He appears well-developed and well-nourished.  HENT:  Head: Normocephalic and atraumatic.  Eyes: EOM are normal.  Neck: Normal range of motion.  Cardiovascular: Normal rate, regular rhythm, normal heart sounds and intact distal pulses.   Pulmonary/Chest: Effort normal and breath sounds normal. No respiratory distress.  Abdominal: Soft. He exhibits no distension. There is no tenderness.  Musculoskeletal: Normal range of motion.  Neurological: He is alert and oriented to person, place, and time.  Skin: Skin is warm and dry.  Psychiatric: He has a normal mood and affect. Judgment normal.  Nursing note and vitals reviewed.    ED Treatments / Results  Labs (all labs ordered are listed, but only abnormal results are displayed) Labs Reviewed  URINALYSIS, ROUTINE W REFLEX MICROSCOPIC - Abnormal; Notable for the following:       Result Value   Color, Urine STRAW (*)    Specific Gravity, Urine 1.004 (*)    All other components within normal limits  I-STAT CHEM 8, ED    EKG  EKG Interpretation None       Radiology Ct Renal Stone Study  Result Date: 12/22/2016 CLINICAL DATA:  Right flank pain radiating into right lower quadrant EXAM: CT ABDOMEN AND PELVIS  WITHOUT CONTRAST TECHNIQUE: Multidetector CT imaging of the abdomen and pelvis was performed following the standard protocol without oral or intravenous contrast maternal administration. COMPARISON:  None. FINDINGS: Lower chest: There is bibasilar atelectatic change. Lung bases are otherwise clear. Hepatobiliary: Liver measures 20.0 cm in length. No focal liver lesions are appreciable on this noncontrast enhanced study. There is a tiny gallstone in the gallbladder. Gallbladder wall does not appear appreciably thickened. There is no biliary duct dilatation. Pancreas: There is no pancreatic mass or inflammatory focus. Spleen: No splenic lesions are evident.  Adrenals/Urinary Tract: Adrenals appear normal bilaterally. There is a cyst arising from the medial mid left kidney measuring 3.0 x 2.3 cm. There is no hydronephrosis on either side. There is no renal or ureteral calculus on either side. The urinary bladder is midline with wall thickness within normal limits. Stomach/Bowel: There is no appreciable bowel wall or mesenteric thickening. There is no bowel obstruction. No free air or portal venous air evident. Vascular/Lymphatic: There is no abdominal aortic aneurysm. The major mesenteric vessels appear patent on this noncontrast enhanced study. No adenopathy is evident in the abdomen or pelvis. Reproductive: The prostate is diffusely enlarged. The prostate impresses on the inferior urinary bladder, and the border of the prostate cannot be entirely separated from the urinary bladder. Seminal vesicles appear upper normal in size and normal in contour. Other: Appendix appears unremarkable. No abscess or ascites is appreciable in the abdomen or pelvis. There is a minimal ventral hernia containing only fat. Musculoskeletal: There is degenerative change in the lumbar spine. There are no blastic or lytic bone lesions. There is no intramuscular or abdominal wall lesion. IMPRESSION: Marked prostatic enlargement. Prostate impresses upon the inferior urinary bladder, and the border of the prostate cannot be separated from the the inferior bladder. This finding warrants PSA correlation. This finding may also warrant urologic consultation given the size of the prostate. No renal or ureteral calculi. No hydronephrosis. Appendix appears normal. Prominent liver without focal lesion. Apparent tiny gallstone in gallbladder. Gallbladder wall does not appear thickened. Minimal ventral hernia containing only fat. Electronically Signed   By: Bretta Bang III M.D.   On: 12/22/2016 13:17    Procedures Procedures (including critical care time)  Medications Ordered in ED Medications    oxyCODONE-acetaminophen (PERCOCET/ROXICET) 5-325 MG per tablet 1 tablet (1 tablet Oral Given 12/22/16 1216)     Initial Impression / Assessment and Plan / ED Course  I have reviewed the triage vital signs and the nursing notes.  Pertinent labs & imaging results that were available during my care of the patient were reviewed by me and considered in my medical decision making (see chart for details).    No clear-cut etiology for the patient's pain.  This could be musculoskeletal.  He does have a significantly enlarged prostate on CT imaging without evidence of prostatic abscess.  He'll be placed on ciprofloxacin as this could represent prostatitis as a cause of his low back pain given how low his pain is.  Outpatient urology follow-up.  Primary care follow-up.  He understands return to the ER for new or worsening symptoms  Final Clinical Impressions(s) / ED Diagnoses   Final diagnoses:  Flank pain  Enlarged prostate    New Prescriptions New Prescriptions   CIPROFLOXACIN (CIPRO) 500 MG TABLET    Take 1 tablet (500 mg total) by mouth 2 (two) times daily.   IBUPROFEN (ADVIL,MOTRIN) 600 MG TABLET    Take 1 tablet (600 mg total) by mouth  every 8 (eight) hours as needed.     Azalia Bilis, MD 12/22/16 [phone]

## 2019-11-16 ENCOUNTER — Other Ambulatory Visit: Payer: Self-pay

## 2019-11-16 ENCOUNTER — Emergency Department (HOSPITAL_COMMUNITY): Payer: Self-pay

## 2019-11-16 ENCOUNTER — Emergency Department (HOSPITAL_COMMUNITY)
Admission: EM | Admit: 2019-11-16 | Discharge: 2019-11-16 | Disposition: A | Discharge status: Home / Self Care | Payer: Self-pay | Attending: Emergency Medicine | Admitting: Emergency Medicine

## 2019-11-16 ENCOUNTER — Encounter (HOSPITAL_COMMUNITY): Payer: Self-pay

## 2019-11-16 DIAGNOSIS — F17210 Nicotine dependence, cigarettes, uncomplicated: Secondary | ICD-10-CM | POA: Insufficient documentation

## 2019-11-16 DIAGNOSIS — R0981 Nasal congestion: Secondary | ICD-10-CM | POA: Insufficient documentation

## 2019-11-16 DIAGNOSIS — I10 Essential (primary) hypertension: Secondary | ICD-10-CM | POA: Insufficient documentation

## 2019-11-16 DIAGNOSIS — Z20822 Contact with and (suspected) exposure to covid-19: Secondary | ICD-10-CM | POA: Insufficient documentation

## 2019-11-16 DIAGNOSIS — Z79899 Other long term (current) drug therapy: Secondary | ICD-10-CM | POA: Insufficient documentation

## 2019-11-16 HISTORY — DX: Benign prostatic hyperplasia without lower urinary tract symptoms: N40.0

## 2019-11-16 LAB — SARS CORONAVIRUS 2 (TAT 6-24 HRS): SARS Coronavirus 2: NEGATIVE

## 2019-11-16 LAB — INFLUENZA PANEL BY PCR (TYPE A & B)
Influenza A By PCR: NEGATIVE
Influenza B By PCR: NEGATIVE

## 2019-11-16 MED ORDER — ACETAMINOPHEN 325 MG PO TABS
650.0000 mg | ORAL_TABLET | Freq: Once | ORAL | Status: AC
  Administered 2019-11-16: 650 mg via ORAL
  Filled 2019-11-16: qty 2

## 2019-11-16 MED ORDER — AZITHROMYCIN 250 MG PO TABS: ORAL_TABLET | ORAL | 0 refills | Status: DC

## 2019-11-16 MED ORDER — CLONIDINE HCL 0.1 MG PO TABS
0.2000 mg | ORAL_TABLET | Freq: Once | ORAL | Status: AC
  Administered 2019-11-16: 0.2 mg via ORAL
  Filled 2019-11-16: qty 2

## 2019-11-16 MED ORDER — GUAIFENESIN 100 MG/5ML PO SOLN
5.0000 mL | Freq: Once | ORAL | Status: AC
  Administered 2019-11-16: 12:00:00 100 mg via ORAL
  Filled 2019-11-16: qty 10

## 2019-11-16 MED ORDER — LISINOPRIL 10 MG PO TABS: 10.0000 mg | ORAL_TABLET | Freq: Every day | ORAL | 0 refills | Status: DC

## 2019-11-16 NOTE — Discharge Instructions (Addendum)
You are seen today for cough and congestion.  Your x-ray shows that you may have pneumonia.  This pneumonia could be from COVID-19.  We are starting you on an antibiotic as well as a new blood pressure medication.  You need to stay home and isolate yourself until you know the results of your test.  Please follow-up with primary care in regards to your blood pressure. Thank you for allowing me to care for you today. Please return to the emergency department if you have new or worsening symptoms. Take your medications as instructed.

## 2019-11-16 NOTE — ED Triage Notes (Signed)
Patient c/o chills, fatigue, and a productive cough with green sputum x 1 week.

## 2019-11-16 NOTE — ED Provider Notes (Signed)
John Woods is a 54 y.o. male.  Patient is a 54 year old gentleman with no significant past medical history presenting to the emergency department for URI symptoms.  Patient reports that for the past 4 to 5 days he has had increased chest congestion, nasal congestion, sinus pressure, cough.  Reports that he has a productive cough of green sputum in the morning which changes to white sputum and afternoon.  Denies any known sick contacts, denies any        Past Medical History:  Diagnosis Date  . Allergy   . Enlarged prostate     Patient Active Problem List   Diagnosis Date Noted  . 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 Nicotine dependence 03/12/2013  . HTN (hypertension) 03/18/2012  . Dental disease 03/18/2012    History reviewed. No pertinent surgical history.     Family History  Problem Relation Age of Onset  . Cancer Mother   . Cancer Father     Social History   Tobacco Use  . Smoking status: Current Every Day Smoker    Packs/day: 1.00    Types: Cigarettes  . Smokeless tobacco: Never Used  Substance Use Topics  . Alcohol use: No  . Drug use: No    Home Medications Prior to Admission medications   Medication Sig Start Date End Date Taking? Authorizing Provider  azithromycin (ZITHROMAX) 250 MG tablet Zpack instructions. 500mg  day 1and 250mg  each day after until gone 11/16/19   A, PA-C  ciprofloxacin (CIPRO) 500 MG tablet Take 1 tablet (500 mg total) by mouth 2 (two) times daily. 12/22/16   John Doss, MD  ibuprofen (ADVIL,MOTRIN) 600 MG tablet Take 1 tablet (600 mg total) by mouth every 8 (eight) hours as needed. 12/22/16   Azalia Bilis, MD  lisinopril (ZESTRIL) 10 MG tablet Take 1 tablet (10 mg total) by mouth daily. 11/16/19 12/16/19  11/18/19 A, PA-C    OVER THE COUNTER MEDICATION Take 45 mLs by mouth daily. Olive leaf oil     [provider]    Allergies    Patient has no known allergies.  Review of Systems   Review of Systems  Constitutional: Negative for appetite change, chills, fatigue and fever.  HENT: Positive for congestion, rhinorrhea, sinus pressure and sore throat. Negative for dental problem, sneezing, tinnitus and trouble swallowing.   Eyes: Negative for visual disturbance.  Respiratory: Positive for cough. Negative for shortness of breath and wheezing.   Cardiovascular: Negative for chest pain.  Gastrointestinal: Negative for abdominal pain, nausea and rectal pain.  Genitourinary: Negative for dysuria.  Musculoskeletal: Negative for back pain and myalgias.  Skin: Negative for rash.  Neurological: Negative for dizziness and light-headedness.    Physical Exam Updated Vital Signs BP (!) 218/112   Pulse (!) 48   Temp 97.8 F (36.6 C) (Oral)   Resp 13   Ht 6\' 3"  (1.905 m)   Wt 95.3 kg   SpO2 99%   BMI 26.25 kg/m   Physical Exam Vitals and nursing note reviewed.  Constitutional:      General: He is not in acute distress.    Appearance: Normal appearance. He is not ill-appearing, toxic-appearing or diaphoretic.  HENT:     Head: Normocephalic.     Nose:  Nose normal. No congestion.     Mouth/Throat:     Mouth: Mucous membranes are moist.  Eyes:     Conjunctiva/sclera: Conjunctivae normal.  Cardiovascular:     Rate and Rhythm: Normal rate and regular rhythm.     Pulses: Normal pulses.  Pulmonary:     Effort: Pulmonary effort is normal.     Breath sounds: Normal breath sounds.  Abdominal:     General: Abdomen is flat.     Tenderness: There is no abdominal tenderness.  Skin:    General: Skin is dry.  Neurological:     General: No focal deficit present.     Mental Status: He is alert.  Psychiatric:        Mood and Affect: Mood normal.     ED Results / Procedures / Treatments   Labs (all  labs ordered are listed, but only abnormal results are displayed) Labs Reviewed  SARS CORONAVIRUS 2 (TAT 6-24 HRS)  INFLUENZA PANEL BY PCR (TYPE A & B)    EKG EKG Interpretation  Date/Time:  Saturday November 16 2019 10:56:00 EST Ventricular Rate:  66 PR Interval:    QRS Duration: 95 QT Interval:  454 QTC Calculation: 476 R Axis:   60 Text Interpretation: Sinus rhythm Consider left atrial enlargement Abnormal R-wave progression, early transition Left ventricular hypertrophy Borderline prolonged QT interval No old tracing to compare Confirmed by Linwood Dibbles [phone]) on 11/16/2019 11:34:50 AM   Radiology DG Chest Portable 1 View  Result Date: 11/16/2019 CLINICAL DATA:  54 year old male with a history of chills fatigue and productive cough EXAM: PORTABLE CHEST 1 VIEW COMPARISON:  None. FINDINGS: Cardiomediastinal silhouette within normal limits. No evidence of central vascular congestion. No pneumothorax or pleural effusion. Hazy opacities in the mid and lower lungs, with low lung volumes. No displaced fracture IMPRESSION: Vague opacities in the mid and lower lungs potentially atelectasis versus early infection. Electronically Signed   By: Gilmer Mor D.O.   On: 11/16/2019 12:06    Procedures Procedures (including critical care time)  Medications Ordered in ED Medications  guaiFENesin (ROBITUSSIN) 100 MG/5ML solution 100 mg (100 mg Oral Given 11/16/19 1141)  acetaminophen (TYLENOL) tablet 650 mg (650 mg Oral Given 11/16/19 1141)  cloNIDine (CATAPRES) tablet 0.2 mg (0.2 mg Oral Given 11/16/19 1236)    ED Course  I have reviewed the triage vital signs and the nursing notes.  Pertinent labs & imaging results that were available during my care of the patient were reviewed by me and considered in my medical decision making (see chart for details).  Clinical Course as of Nov 15 1310  Sat Nov 16, 2019  [id number] 54 year old gentleman presenting with URI symptoms for the last 5 days.  Appears  well on exam with clear lung sounds.  He is hypertensive on exam and has history of hypertension but is not taking any medication.  Otherwise vitals are normal.  Patient's x-rays show some opacities in the bilateral lower lungs which could be atypical pneumonia.  Covid test is pending.  Will start patient on antibiotics for possible bacterial pneumonia versus Covid pneumonia.  This was discussed with the patient.  Patient is not hypoxic or meeting any kind of inpatient criteria and so he would be treated on outpatient basis.   [KM]    Clinical Course User Index [KM] Jeral Pinch   MDM Rules/Calculators/A&P  Based on review of vitals, medical screening exam, lab work and/or imaging, there does not appear to be an acute, emergent etiology for the patient's symptoms. Counseled pt on good return precautions and encouraged both PCP and ED follow-up as needed.  Prior to discharge, I also discussed incidental imaging findings with patient in detail and advised appropriate, recommended follow-up in detail.  Clinical Impression: 1. Suspected COVID-19 virus infection   2. Hypertension, unspecified type     Disposition: Discharge  Prior to providing a prescription for a controlled substance, I independently reviewed the patient's recent prescription history on the [facility]. The patient had no recent or regular prescriptions and was deemed appropriate for a brief, less than 3 day prescription of narcotic for acute analgesia.  This note was prepared with assistance of Systems analyst. Occasional wrong-word or sound-a-like substitutions may have occurred due to the inherent limitations of voice recognition software.  Final Clinical Impression(s) / ED Diagnoses Final diagnoses:  Suspected COVID-19 virus infection  Hypertension, unspecified type    Rx / DC Orders ED Discharge Orders         Ordered     azithromycin (ZITHROMAX) 250 MG tablet     11/16/19 1233    lisinopril (ZESTRIL) 10 MG tablet  Daily     11/16/19 1233           Kristine Royal 11/16/19 1312    Dorie Rank, MD 11/17/19 [phone]

## 2019-11-25 DIAGNOSIS — N179 Acute kidney failure, unspecified: Secondary | ICD-10-CM

## 2019-11-25 HISTORY — DX: Acute kidney failure, unspecified: N17.9

## 2019-12-12 ENCOUNTER — Other Ambulatory Visit: Payer: Self-pay

## 2019-12-12 ENCOUNTER — Encounter (HOSPITAL_COMMUNITY): Payer: Self-pay

## 2019-12-12 ENCOUNTER — Inpatient Hospital Stay (HOSPITAL_COMMUNITY)
Admission: EM | Admit: 2019-12-12 | Discharge: 2019-12-18 | DRG: 684 | Disposition: A | Discharge status: Home / Self Care | Payer: Self-pay | Attending: Internal Medicine | Admitting: Internal Medicine

## 2019-12-12 ENCOUNTER — Emergency Department (HOSPITAL_COMMUNITY): Payer: Self-pay

## 2019-12-12 DIAGNOSIS — R3129 Other microscopic hematuria: Secondary | ICD-10-CM | POA: Diagnosis present

## 2019-12-12 DIAGNOSIS — Z20822 Contact with and (suspected) exposure to covid-19: Secondary | ICD-10-CM | POA: Diagnosis present

## 2019-12-12 DIAGNOSIS — K5900 Constipation, unspecified: Secondary | ICD-10-CM | POA: Diagnosis present

## 2019-12-12 DIAGNOSIS — N3289 Other specified disorders of bladder: Secondary | ICD-10-CM | POA: Diagnosis not present

## 2019-12-12 DIAGNOSIS — E875 Hyperkalemia: Secondary | ICD-10-CM | POA: Diagnosis present

## 2019-12-12 DIAGNOSIS — N179 Acute kidney failure, unspecified: Principal | ICD-10-CM | POA: Diagnosis present

## 2019-12-12 DIAGNOSIS — N401 Enlarged prostate with lower urinary tract symptoms: Secondary | ICD-10-CM | POA: Diagnosis present

## 2019-12-12 DIAGNOSIS — D649 Anemia, unspecified: Secondary | ICD-10-CM | POA: Diagnosis present

## 2019-12-12 DIAGNOSIS — E861 Hypovolemia: Secondary | ICD-10-CM | POA: Diagnosis present

## 2019-12-12 DIAGNOSIS — R338 Other retention of urine: Secondary | ICD-10-CM | POA: Diagnosis present

## 2019-12-12 DIAGNOSIS — N1330 Unspecified hydronephrosis: Secondary | ICD-10-CM | POA: Diagnosis present

## 2019-12-12 DIAGNOSIS — Z66 Do not resuscitate: Secondary | ICD-10-CM | POA: Diagnosis present

## 2019-12-12 DIAGNOSIS — I10 Essential (primary) hypertension: Secondary | ICD-10-CM | POA: Diagnosis present

## 2019-12-12 DIAGNOSIS — R339 Retention of urine, unspecified: Secondary | ICD-10-CM

## 2019-12-12 DIAGNOSIS — Z79899 Other long term (current) drug therapy: Secondary | ICD-10-CM

## 2019-12-12 DIAGNOSIS — N400 Enlarged prostate without lower urinary tract symptoms: Secondary | ICD-10-CM

## 2019-12-12 DIAGNOSIS — F17210 Nicotine dependence, cigarettes, uncomplicated: Secondary | ICD-10-CM | POA: Diagnosis present

## 2019-12-12 HISTORY — DX: Essential (primary) hypertension: I10

## 2019-12-12 LAB — COMPREHENSIVE METABOLIC PANEL
ALT: 19 U/L (ref 0–44)
AST: 16 U/L (ref 15–41)
Albumin: 4.5 g/dL (ref 3.5–5.0)
Alkaline Phosphatase: 67 U/L (ref 38–126)
Anion gap: 12 (ref 5–15)
BUN: 68 mg/dL — ABNORMAL HIGH (ref 6–20)
CO2: 22 mmol/L (ref 22–32)
Calcium: 9.1 mg/dL (ref 8.9–10.3)
Chloride: 106 mmol/L (ref 98–111)
Creatinine, Ser: 10.32 mg/dL — ABNORMAL HIGH (ref 0.61–1.24)
GFR calc Af Amer: 6 mL/min — ABNORMAL LOW (ref >60)
GFR calc non Af Amer: 5 mL/min — ABNORMAL LOW (ref >60)
Glucose, Bld: 140 mg/dL — ABNORMAL HIGH (ref 70–99)
Potassium: 5.4 mmol/L — ABNORMAL HIGH (ref 3.5–5.1)
Sodium: 140 mmol/L (ref 135–145)
Total Bilirubin: 0.9 mg/dL (ref 0.3–1.2)
Total Protein: 8.2 g/dL — ABNORMAL HIGH (ref 6.5–8.1)

## 2019-12-12 LAB — CBC
HCT: 29.7 % — ABNORMAL LOW (ref 39.0–52.0)
Hemoglobin: 9.6 g/dL — ABNORMAL LOW (ref 13.0–17.0)
MCH: 31.2 pg (ref 26.0–34.0)
MCHC: 32.3 g/dL (ref 30.0–36.0)
MCV: 96.4 fL (ref 80.0–100.0)
Platelets: 208 10*3/uL (ref 150–400)
RBC: 3.08 MIL/uL — ABNORMAL LOW (ref 4.22–5.81)
RDW: 11.5 % (ref 11.5–15.5)
WBC: 7 10*3/uL (ref 4.0–10.5)
nRBC: 0 % (ref 0.0–0.2)

## 2019-12-12 LAB — URINALYSIS, ROUTINE W REFLEX MICROSCOPIC
Bacteria, UA: NONE SEEN
Bilirubin Urine: NEGATIVE
Glucose, UA: NEGATIVE mg/dL
Ketones, ur: NEGATIVE mg/dL
Nitrite: NEGATIVE
Protein, ur: NEGATIVE mg/dL
Specific Gravity, Urine: 1.006 (ref 1.005–1.030)
pH: 6 (ref 5.0–8.0)

## 2019-12-12 LAB — FERRITIN: Ferritin: 509 ng/mL — ABNORMAL HIGH (ref 24–336)

## 2019-12-12 LAB — RENAL FUNCTION PANEL
Albumin: 4.5 g/dL (ref 3.5–5.0)
Anion gap: 11 (ref 5–15)
BUN: 66 mg/dL — ABNORMAL HIGH (ref 6–20)
CO2: 24 mmol/L (ref 22–32)
Calcium: 9.2 mg/dL (ref 8.9–10.3)
Chloride: 109 mmol/L (ref 98–111)
Creatinine, Ser: 9 mg/dL — ABNORMAL HIGH (ref 0.61–1.24)
GFR calc Af Amer: 7 mL/min — ABNORMAL LOW (ref >60)
GFR calc non Af Amer: 6 mL/min — ABNORMAL LOW (ref >60)
Glucose, Bld: 119 mg/dL — ABNORMAL HIGH (ref 70–99)
Phosphorus: 5.2 mg/dL — ABNORMAL HIGH (ref 2.5–4.6)
Potassium: 6.1 mmol/L — ABNORMAL HIGH (ref 3.5–5.1)
Sodium: 144 mmol/L (ref 135–145)

## 2019-12-12 LAB — IRON AND TIBC
Iron: 72 ug/dL (ref 45–182)
Saturation Ratios: 24 % (ref 17.9–39.5)
TIBC: 297 ug/dL (ref 250–450)
UIBC: 225 ug/dL

## 2019-12-12 LAB — SARS CORONAVIRUS 2 (TAT 6-24 HRS): SARS Coronavirus 2: NEGATIVE

## 2019-12-12 LAB — POTASSIUM: Potassium: 5.8 mmol/L — ABNORMAL HIGH (ref 3.5–5.1)

## 2019-12-12 LAB — LIPASE, BLOOD: Lipase: 32 U/L (ref 11–51)

## 2019-12-12 LAB — HIV ANTIBODY (ROUTINE TESTING W REFLEX): HIV Screen 4th Generation wRfx: NONREACTIVE

## 2019-12-12 MED ORDER — SODIUM CHLORIDE 0.9 % IV BOLUS
250.0000 mL | Freq: Once | INTRAVENOUS | Status: AC
  Administered 2019-12-12: 17:00:00 250 mL via INTRAVENOUS

## 2019-12-12 MED ORDER — SODIUM CHLORIDE 0.9 % IV BOLUS
1000.0000 mL | Freq: Once | INTRAVENOUS | Status: AC
  Administered 2019-12-12: 12:00:00 1000 mL via INTRAVENOUS

## 2019-12-12 MED ORDER — SODIUM CHLORIDE 0.45 % IV SOLN: INTRAVENOUS | Status: DC

## 2019-12-12 MED ORDER — ONDANSETRON HCL 4 MG/2ML IJ SOLN: 4.0000 mg | Freq: Four times a day (QID) | INTRAMUSCULAR | Status: DC | PRN

## 2019-12-12 MED ORDER — HEPARIN SODIUM (PORCINE) 5000 UNIT/ML IJ SOLN
5000.0000 [IU] | Freq: Three times a day (TID) | INTRAMUSCULAR | Status: DC
  Administered 2019-12-12 – 2019-12-18 (×18): 5000 [IU] via SUBCUTANEOUS
  Filled 2019-12-12 (×15): qty 1

## 2019-12-12 MED ORDER — HYDRALAZINE HCL 10 MG PO TABS
10.0000 mg | ORAL_TABLET | Freq: Three times a day (TID) | ORAL | Status: DC | PRN
  Administered 2019-12-12: 17:00:00 10 mg via ORAL
  Filled 2019-12-12: qty 1

## 2019-12-12 MED ORDER — OXYBUTYNIN CHLORIDE 5 MG PO TABS
5.0000 mg | ORAL_TABLET | Freq: Three times a day (TID) | ORAL | Status: DC | PRN
  Administered 2019-12-12 – 2019-12-16 (×5): 5 mg via ORAL
  Filled 2019-12-12 (×6): qty 1

## 2019-12-12 MED ORDER — SODIUM CHLORIDE 0.9% FLUSH
3.0000 mL | Freq: Once | INTRAVENOUS | Status: AC
  Administered 2019-12-12: 12:00:00 3 mL via INTRAVENOUS

## 2019-12-12 MED ORDER — MORPHINE SULFATE (PF) 4 MG/ML IV SOLN
4.0000 mg | Freq: Once | INTRAVENOUS | Status: AC
  Administered 2019-12-12: 14:00:00 4 mg via INTRAVENOUS
  Filled 2019-12-12: qty 1

## 2019-12-12 MED ORDER — ONDANSETRON HCL 4 MG/2ML IJ SOLN
4.0000 mg | Freq: Once | INTRAMUSCULAR | Status: AC
  Administered 2019-12-12: 11:00:00 4 mg via INTRAVENOUS
  Filled 2019-12-12: qty 2

## 2019-12-12 MED ORDER — TAMSULOSIN HCL 0.4 MG PO CAPS
0.4000 mg | ORAL_CAPSULE | Freq: Every day | ORAL | Status: DC
  Administered 2019-12-12 – 2019-12-18 (×7): 0.4 mg via ORAL
  Filled 2019-12-12 (×7): qty 1

## 2019-12-12 MED ORDER — AMLODIPINE BESYLATE 5 MG PO TABS
5.0000 mg | ORAL_TABLET | Freq: Every day | ORAL | Status: DC
  Administered 2019-12-12 – 2019-12-18 (×7): 5 mg via ORAL
  Filled 2019-12-12 (×7): qty 1

## 2019-12-12 MED ORDER — ONDANSETRON HCL 4 MG PO TABS: 4.0000 mg | ORAL_TABLET | Freq: Four times a day (QID) | ORAL | Status: DC | PRN

## 2019-12-12 MED ORDER — ACETAMINOPHEN 650 MG RE SUPP: 650.0000 mg | Freq: Four times a day (QID) | RECTAL | Status: DC | PRN

## 2019-12-12 MED ORDER — MORPHINE SULFATE (PF) 4 MG/ML IV SOLN
4.0000 mg | Freq: Once | INTRAVENOUS | Status: AC
  Administered 2019-12-12: 11:00:00 4 mg via INTRAVENOUS
  Filled 2019-12-12: qty 1

## 2019-12-12 MED ORDER — HYDROCODONE-ACETAMINOPHEN 5-325 MG PO TABS
1.0000 | ORAL_TABLET | Freq: Four times a day (QID) | ORAL | Status: DC | PRN
  Administered 2019-12-12 – 2019-12-16 (×5): 2 via ORAL
  Filled 2019-12-12 (×7): qty 2

## 2019-12-12 MED ORDER — LIDOCAINE HCL URETHRAL/MUCOSAL 2 % EX GEL
CUTANEOUS | Status: AC
  Administered 2019-12-12: 1 via URETHRAL
  Filled 2019-12-12: qty 30

## 2019-12-12 MED ORDER — SODIUM CHLORIDE 0.45 % IV SOLN: INTRAVENOUS | Status: AC

## 2019-12-12 MED ORDER — MORPHINE SULFATE (PF) 4 MG/ML IV SOLN: 4.0000 mg | INTRAVENOUS | Status: DC | PRN

## 2019-12-12 MED ORDER — LIDOCAINE HCL URETHRAL/MUCOSAL 2 % EX GEL: 1.0000 "application " | Freq: Once | CUTANEOUS | Status: AC

## 2019-12-12 MED ORDER — SODIUM ZIRCONIUM CYCLOSILICATE 10 G PO PACK
10.0000 g | PACK | ORAL | Status: AC
  Administered 2019-12-12 (×2): 10 g via ORAL
  Filled 2019-12-12 (×2): qty 1

## 2019-12-12 MED ORDER — ACETAMINOPHEN 325 MG PO TABS: 650.0000 mg | ORAL_TABLET | Freq: Four times a day (QID) | ORAL | Status: DC | PRN

## 2019-12-12 NOTE — ED Notes (Addendum)
Attempted to give report to. Yvonna Alanis, RN unavailable at this time.

## 2019-12-12 NOTE — ED Provider Notes (Signed)
[facility]-EMERGENCY DEPT Provider Note   CSN: [id number] Arrival date & time: 12/12/19  1054     History Chief Complaint  Patient presents with  . Abdominal Pain    John Woods is a 54 y.o. male.  Patient is a 54 year old male with past medical history of hypertension and tobacco use.  He presents today for evaluation of abdominal pain.  Patient states that he ate a biscuit this morning, then shortly afterward developed right lower abdominal cramping and pain.  He denies any vomiting, but did feel nauseated.  Patient does describe intermittent episodes of abdominal pain over the past few months.  Sometimes he feels constipated, and sometimes he has loose stools.  All have been nonbloody and nonmelanotic.  The history is provided by the patient.  Abdominal Pain Pain location:  RUQ Pain quality: cramping   Pain radiates to:  Does not radiate Pain severity:  Severe Onset quality:  Sudden Timing:  Constant Progression:  Partially resolved Chronicity:  New Relieved by:  Nothing Worsened by:  Nothing Ineffective treatments:  None tried      Past Medical History:  Diagnosis Date  . Allergy   . Enlarged prostate   . Hypertension     Patient Active Problem List   Diagnosis Date Noted  . 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 Nicotine dependence 03/12/2013  . HTN (hypertension) 03/18/2012  . Dental disease 03/18/2012    History reviewed. No pertinent surgical history.     Family History  Problem Relation Age of Onset  . Cancer Mother   . Cancer Father     Social History   Tobacco Use  . Smoking status: Current Every Day Smoker    Packs/day: 1.00    Types: Cigarettes  . Smokeless tobacco: Never Used  Substance Use Topics  . Alcohol use: No  . Drug use: No    Home Medications Prior to Admission medications   Medication Sig Start Date End Date Taking? Authorizing Provider  azithromycin (ZITHROMAX) 250 MG tablet Zpack instructions. 500mg  day 1and  250mg  each day after until gone 11/16/19   A, PA-C  ciprofloxacin (CIPRO) 500 MG tablet Take 1 tablet (500 mg total) by mouth 2 (two) times daily. 12/22/16   Ronnie Doss, MD  ibuprofen (ADVIL,MOTRIN) 600 MG tablet Take 1 tablet (600 mg total) by mouth every 8 (eight) hours as needed. 12/22/16   Azalia Bilis, MD  lisinopril (ZESTRIL) 10 MG tablet Take 1 tablet (10 mg total) by mouth daily. 11/16/19 12/16/19  11/18/19 A, PA-C  OVER THE COUNTER MEDICATION Take 45 mLs by mouth daily. Olive leaf oil     [provider]    Allergies    Patient has no known allergies.  Review of Systems   Review of Systems  Gastrointestinal: Positive for abdominal pain.  All other systems reviewed and are negative.   Physical Exam Updated Vital Signs BP (!) 181/85   Pulse 62   Temp (!) 97.5 F (36.4 C) (Oral)   Resp 18   SpO2 98%   Physical Exam Vitals and nursing note reviewed.  Constitutional:      General: He is not in acute distress.    Appearance: He is well-developed. He is not diaphoretic.  HENT:     Head: Normocephalic and atraumatic.  Cardiovascular:     Rate and Rhythm: Normal rate and regular rhythm.     Heart sounds: No murmur. No friction rub.  Pulmonary:     Effort:  Pulmonary effort is normal. No respiratory distress.     Breath sounds: Normal breath sounds. No wheezing or rales.  Abdominal:     General: Bowel sounds are normal. There is no distension.     Palpations: Abdomen is soft.     Tenderness: There is abdominal tenderness in the right lower quadrant. There is no right CVA tenderness, left CVA tenderness, guarding or rebound.  Musculoskeletal:        General: Normal range of motion.     Cervical back: Normal range of motion and neck supple.  Skin:    General: Skin is warm and dry.  Neurological:     Mental Status: He is alert and oriented to person, place, and time.     Coordination: Coordination normal.     ED Results / Procedures /  Treatments   Labs (all labs ordered are listed, but only abnormal results are displayed) Labs Reviewed  LIPASE, BLOOD  COMPREHENSIVE METABOLIC PANEL  CBC  URINALYSIS, ROUTINE W REFLEX MICROSCOPIC    EKG None  Radiology No results found.  Procedures Procedures (including critical care time)  Medications Ordered in ED Medications  sodium chloride flush (NS) 0.9 % injection 3 mL (has no administration in time range)  ondansetron (ZOFRAN) injection 4 mg (has no administration in time range)  morphine 4 MG/ML injection 4 mg (has no administration in time range)  sodium chloride 0.9 % bolus 1,000 mL (has no administration in time range)    ED Course  I have reviewed the triage vital signs and the nursing notes.  Pertinent labs & imaging results that were available during my care of the patient were reviewed by me and considered in my medical decision making (see chart for details).    MDM Rules/Calculators/A&P  Patient presenting with lower abdominal pain.  Suddenly after eating a biscuit while at work.  Patient is tender in the right lower quadrant.  Concerns for appendicitis to be addressed by CT scan, however patient has a creatinine of 10.  CT performed without contrast reveals marked enlargement of the prostate with bladder distention and bilateral hydronephrosis consistent with bladder outlet obstruction and post-renal renal failure.    Foley catheter placed with 1500 cc of clear urine obtained.    Care discussed with nephrology who recommends consultation if creatinine not improving in the morning.  Urology notified as well and patient to be admitted to the Hospitalist service under the care of Dr. Lonny Prude.   CRITICAL CARE Performed by: Veryl Speak Total critical care time: 35 minutes Critical care time was exclusive of separately billable procedures and treating other patients. Critical care was necessary to treat or prevent imminent or life-threatening  deterioration. Critical care was time spent personally by me on the following activities: development of treatment plan with patient and/or surrogate as well as nursing, discussions with consultants, evaluation of patient's response to treatment, examination of patient, obtaining history from patient or surrogate, ordering and performing treatments and interventions, ordering and review of laboratory studies, ordering and review of radiographic studies, pulse oximetry and re-evaluation of patient's condition.  Final Clinical Impression(s) / ED Diagnoses Final diagnoses:  None    Rx / DC Orders ED Discharge Orders    None       Veryl Speak, MD 12/12/19 1408

## 2019-12-12 NOTE — ED Notes (Signed)
Report called to [facility] on floor. While this RN giving report, Nephrologist approaches this RN and requests pt to have Foley clamped for 30 minutes when pt arrives to floor. This information passed on to [place], RN with Nephrologist here. RN expresses understanding. Pt is A/Ox3. Skin w/d/pink. Resp wnl, euqal and non-labored. NAD. No complaints voiced. Pt transferred to floor via stretcher with belongings.

## 2019-12-12 NOTE — ED Notes (Signed)
Patient ambulated to restroom to provide UA.

## 2019-12-12 NOTE — ED Notes (Signed)
Dr. Judd Lien made aware of pt's current BP. No further orders given at this time.

## 2019-12-12 NOTE — ED Triage Notes (Signed)
Patient arrived via [facility] from work.  C/o abdominal pain X1 year. Patient reports abdominal swelling that stopped 2 months ago.  Patient reports eating a sausage biscuit this morning and right after developing sharp lower abdominal pain that made him feel like he was going to pass out.   Denies LOC.   Patient recently seen in january but did not report his abdominal pain   Hx. Hypertension

## 2019-12-12 NOTE — ED Notes (Addendum)
S/B Nephrology. Medicated a/o for elevated BP. Emptied 1300 ml clear yellow urine from Foley bag. Pt comfortable at this time. NAD. No complaints voiced.

## 2019-12-12 NOTE — Consult Note (Signed)
[facility] Renal Consultation Note  Requesting MD: Veryl Speak, MD Indication for Consultation: Acute renal failure  Chief complaint: Abdominal pain  HPI:  John Woods is a 54 y.o. male with a history of hypertension, tobacco abuse, and enlarged prostate who presented to the ER today with abdominal pain.  He was found to have acute renal failure with a creatinine of 10.32 as opposed to 0.80 in 12/2016.  Work-up has been notable for CT abdomen pelvis without contrast which demonstrated moderate bilateral hydronephrosis, markedly enlarged prostate gland and circumferentially thickened bladder wall suggestive of chronic bladder outlet obstruction.  PSA and urology consult were recommended.  Internal medicine has been contacted and he is to be [facility] to [facility].  He states that his abdominal pain has gotten better with the Foley placement.  Spoke with nurse who indicates that shortly after the Foley was placed he had 1.4 L of urine output.  He has had approximately another 700 mL over the course of an hour.  He first found out about his prostate after a 12/2016 CT scan demonstrated no hydro but marked prostate enlargement; he has not been on medications for the same.  Note that he has been on lisinopril daily as listed below and reported not taking Advil although this is on a medicine list.  He states that he really only takes NSAIDs a few times a year.  Appetite has been okay but he has had some nausea.  Has had several bananas recently we discussed stopping those for now given his kidney function.  He states that his blood pressure has been high for several years.  SARS coronavirus test is pending.  Was negative in January -was tested at that time due to congestion and shortness of breath.   Creatinine, Ser  Date/Time Value Ref Range Status  12/12/2019 11:16 AM 10.32 (H) 0.61 - 1.24 mg/dL Final  12/22/2016 12:28 PM 0.80 0.61 - 1.24 mg/dL Final   PMHx:   Past Medical History:   Diagnosis Date  . Allergy   . Enlarged prostate   . Hypertension     History reviewed. No pertinent surgical history.  Family Hx:  Family History  Problem Relation Age of Onset  . Cancer Mother   . Cancer Father   No family history of renal failure  Social History:  reports that he has been smoking cigarettes. He has been smoking about 0.50 packs per day. He has never used smokeless tobacco. He reports that he does not drink alcohol or use drugs.  Works 2 jobs  Allergies: No Known Allergies  Medications: Prior to Admission medications   Medication Sig Start Date End Date Taking? Authorizing Provider  acetaminophen (TYLENOL) 500 MG tablet Take 500 mg by mouth every 6 (six) hours as needed for headache.   Yes [provider]  lisinopril (ZESTRIL) 10 MG tablet Take 1 tablet (10 mg total) by mouth daily. 11/16/19 12/16/19 Yes Madilyn Hook A, PA-C  azithromycin (ZITHROMAX) 250 MG tablet Zpack instructions. 500mg  day 1and 250mg  each day after until gone Patient not taking: Reported on 12/12/2019 11/16/19   Alveria Apley, PA-C  ciprofloxacin (CIPRO) 500 MG tablet Take 1 tablet (500 mg total) by mouth 2 (two) times daily. Patient not taking: Reported on 12/12/2019 12/22/16   Jola Schmidt, MD  ibuprofen (ADVIL,MOTRIN) 600 MG tablet Take 1 tablet (600 mg total) by mouth every 8 (eight) hours as needed. Patient not taking: Reported on 12/12/2019 12/22/16   Jola Schmidt, MD  I have reviewed the patient's current medications.  Labs:  BMP Latest Ref Rng & Units 12/12/2019 12/22/2016  Glucose 70 - 99 mg/dL 272(Z) 92  BUN 6 - 20 mg/dL 36(U) 7  Creatinine 4.40 - 1.24 mg/dL 34.74(Q) 5.95  Sodium 638 - 145 mmol/L 140 143  Potassium 3.5 - 5.1 mmol/L 5.4(H) 4.0  Chloride 98 - 111 mmol/L 106 102  CO2 22 - 32 mmol/L 22 -  Calcium 8.9 - 10.3 mg/dL 9.1 -    Urinalysis    Component Value Date/Time   COLORURINE STRAW (A) 12/12/2019 1110   APPEARANCEUR CLEAR 12/12/2019 1110   LABSPEC 1.006  12/12/2019 1110   PHURINE 6.0 12/12/2019 1110   GLUCOSEU NEGATIVE 12/12/2019 1110   HGBUR MODERATE (A) 12/12/2019 1110   BILIRUBINUR NEGATIVE 12/12/2019 1110   KETONESUR NEGATIVE 12/12/2019 1110   PROTEINUR NEGATIVE 12/12/2019 1110   NITRITE NEGATIVE 12/12/2019 1110   LEUKOCYTESUR TRACE (A) 12/12/2019 1110     ROS:  Pertinent items noted in HPI and remainder of comprehensive ROS otherwise negative.   Physical Exam: Vitals:   12/12/19 1345 12/12/19 1400  BP:  (!) 190/97  Pulse: 70 72  Resp:  18  Temp:    SpO2: 100% 100%     General: Adult male in stretcher in no acute distress at rest HEENT: Normocephalic atraumatic  Eyes: Extraocular movements intact sclera anicteric Neck: Supple trachea midline Heart: S1-S2 no rub appreciated Lungs: Clear to auscultation bilaterally normal work of breathing at rest Abdomen: Soft nondistended some suprapubic tenderness which patient states is much improved normal bowel sounds Extremities: No edema appreciated no cyanosis or clubbing Skin: No rash on extremities exposed Neuro: Alert and oriented x3 provides a history and follows commands Psych normal mood and affect Genitourinary Foley catheter in place with urine  Assessment/Plan:  # Acute kidney injury - Secondary to obstruction.  Note Ua with neg protein and 11-20 RBC - Place Foley catheter - Recommend urology consult - Would initiate Flomax - Trend repeat UA for hematuria -would wait to repeat as now status post Foley.  Note markedly elevated prostate and urology consult - No acute indication for dialysis and hopeful for improvement with a Foley catheter/relief of obstruction - Update renal panel - Half-normal saline x 12 hours and reassess  - Hold lisinopril and avoid NSAIDs.  Would also avoid morphine for pain given AKI  # Prostatomegaly with obstruction - Foley placement, urology consult, and Flomax  # Hypertension - Hold lisinopril - See that amlodipine has been ordered  and about to be given and he has PRN ordered as well.  # Hyperkalemia - Setting of AKI.  Low potassium diet  # Anemia normocytic - Setting of AKI no acute indication for ESA. - Check iron panel - see this is ordered  # Microscopic hematuria - Note markedly elevated prostate and urology consult  Estanislado Emms 12/12/2019, 3:04 PM

## 2019-12-12 NOTE — H&P (Signed)
History and Physical    John Woods YWV:[id number] DOB: August 17, 1966 DOA: 12/12/2019  PCP: John Woods, No Pcp Per John Woods coming from: Home  Chief Complaint: Abdominal pain  HPI: John Woods is a 54 y.o. male with medical history significant of hypertension, enlarged prostate. John Woods reports sharp pain to his abdomen to the point he felt he was going to pass out. Pain was located near his umbilicus. He did not take anything to help with his symptoms. He reports having symptoms of frequency and urgency for the past year with some intermittent flank pain. He reports using ibuprofen intermittently but not recently. He is adherent with his lisinopril.  ED Course: Vitals: Temperature 97.5 F, pulse of 62, respirations of 18, blood pressure 164/95, 100% on room air Labs: Potassium 5.4, BUN of 68, creatinine of 10.32, hemoglobin of 9.6 LBS chronic kidney disease Imaging: CT abdomen pelvis significant for markedly enlarged prostate, moderate bilateral hydronephrosis, markedly distended urinary bladder Medications/Course: Morphine.  Foley placed  Review of Systems: Review of Systems  Constitutional: Positive for chills (when drinking something cold). Negative for fever.  Respiratory: Negative for cough, sputum production and shortness of breath.   Cardiovascular: Negative for chest pain.  Gastrointestinal: Positive for abdominal pain. Negative for nausea and vomiting.  Genitourinary: Positive for dysuria (pressure), flank pain, frequency and urgency.    Past Medical History:  Diagnosis Date  . Allergy   . Enlarged prostate   . Hypertension     History reviewed. No pertinent surgical history.   reports that he has been smoking cigarettes. He has been smoking about 0.50 packs per day. He has never used smokeless tobacco. He reports that he does not drink alcohol or use drugs.  No Known Allergies  Family History  Problem Relation Age of Onset  . Cancer Mother   . Cancer Father    Prior to  Admission medications   Medication Sig Start Date End Date Taking? Authorizing Provider  acetaminophen (TYLENOL) 500 MG tablet Take 500 mg by mouth every 6 (six) hours as needed for headache.   Yes [provider]  lisinopril (ZESTRIL) 10 MG tablet Take 1 tablet (10 mg total) by mouth daily. 11/16/19 12/16/19 Yes Ronnie Doss A, PA-C  azithromycin (ZITHROMAX) 250 MG tablet Zpack instructions. 500mg  day 1and 250mg  each day after until gone John Woods not taking: Reported on 12/12/2019 11/16/19   12/14/2019, PA-C  ciprofloxacin (CIPRO) 500 MG tablet Take 1 tablet (500 mg total) by mouth 2 (two) times daily. John Woods not taking: Reported on 12/12/2019 12/22/16   12/14/2019, MD  ibuprofen (ADVIL,MOTRIN) 600 MG tablet Take 1 tablet (600 mg total) by mouth every 8 (eight) hours as needed. John Woods not taking: Reported on 12/12/2019 12/22/16   12/14/2019, MD    Physical Exam:  Physical Exam Constitutional:      General: He is not in acute distress.    Appearance: He is well-developed. He is not diaphoretic.  Eyes:     Conjunctiva/sclera: Conjunctivae normal.     Pupils: Pupils are equal, round, and reactive to light.  Cardiovascular:     Rate and Rhythm: Normal rate and regular rhythm.     Heart sounds: Murmur present. Systolic (holosystolic) murmur present with a grade of 1/6.  Pulmonary:     Effort: Pulmonary effort is normal. No respiratory distress.     Breath sounds: Normal breath sounds. No wheezing or rales.  Abdominal:     General: Bowel sounds are normal. There is no  distension.     Palpations: Abdomen is soft.     Tenderness: There is no abdominal tenderness. There is no guarding or rebound.  Musculoskeletal:        General: No tenderness. Normal range of motion.     Cervical back: Normal range of motion.     Right lower leg: No edema.     Left lower leg: No edema.  Lymphadenopathy:     Cervical: No cervical adenopathy.  Skin:    General: Skin is warm and dry.    Neurological:     Mental Status: He is alert and oriented to person, place, and time.     Labs on Admission: I have personally reviewed following labs and imaging studies  CBC: Recent Labs  Lab 12/12/19 1116  WBC 7.0  HGB 9.6*  HCT 29.7*  MCV 96.4  PLT 208    Basic Metabolic Panel: Recent Labs  Lab 12/12/19 1116  NA 140  K 5.4*  CL 106  CO2 22  GLUCOSE 140*  BUN 68*  CREATININE 10.32*  CALCIUM 9.1    GFR: Estimated Creatinine Clearance: 9.9 mL/min (A) (by C-G formula based on SCr of 10.32 mg/dL (H)).  Liver Function Tests: Recent Labs  Lab 12/12/19 1116  AST 16  ALT 19  ALKPHOS 67  BILITOT 0.9  PROT 8.2*  ALBUMIN 4.5   Recent Labs  Lab 12/12/19 1116  LIPASE 32   No results for input(s): AMMONIA in the last 168 hours.  Coagulation Profile: No results for input(s): INR, PROTIME in the last 168 hours.  Cardiac Enzymes: No results for input(s): CKTOTAL, CKMB, CKMBINDEX, TROPONINI in the last 168 hours.  BNP (last 3 results) No results for input(s): PROBNP in the last 8760 hours.  HbA1C: No results for input(s): HGBA1C in the last 72 hours.  CBG: No results for input(s): GLUCAP in the last 168 hours.  Lipid Profile: No results for input(s): CHOL, HDL, LDLCALC, TRIG, CHOLHDL, LDLDIRECT in the last 72 hours.  Thyroid Function Tests: No results for input(s): TSH, T4TOTAL, FREET4, T3FREE, THYROIDAB in the last 72 hours.  Anemia Panel: No results for input(s): VITAMINB12, FOLATE, FERRITIN, TIBC, IRON, RETICCTPCT in the last 72 hours.  Urine analysis:    Component Value Date/Time   COLORURINE STRAW (A) 12/12/2019 1110   APPEARANCEUR CLEAR 12/12/2019 1110   LABSPEC 1.006 12/12/2019 1110   PHURINE 6.0 12/12/2019 1110   GLUCOSEU NEGATIVE 12/12/2019 1110   HGBUR MODERATE (A) 12/12/2019 1110   BILIRUBINUR NEGATIVE 12/12/2019 1110   KETONESUR NEGATIVE 12/12/2019 1110   PROTEINUR NEGATIVE 12/12/2019 1110   NITRITE NEGATIVE 12/12/2019 1110    LEUKOCYTESUR TRACE (A) 12/12/2019 1110     Radiological Exams on Admission: CT ABDOMEN PELVIS WO CONTRAST  Result Date: 12/12/2019 CLINICAL DATA:  Abdominal pain for 1 year EXAM: CT ABDOMEN AND PELVIS WITHOUT CONTRAST TECHNIQUE: Multidetector CT imaging of the abdomen and pelvis was performed following the standard protocol without IV contrast. COMPARISON:  12/22/2016 FINDINGS: Lower chest: No acute abnormality. Hepatobiliary: No focal liver abnormality identified. Unchanged tiny calcification which is either within the gallbladder lumen or immediately adjacent to the gallbladder. Gallbladder is not dilated. No biliary dilatation. Pancreas: Unremarkable. No pancreatic ductal dilatation or surrounding inflammatory changes. Spleen: Normal in size without focal abnormality. Adrenals/Urinary Tract: Unremarkable adrenal glands. Unchanged midpole cyst at the left kidney measuring approximately 3.1 cm. No renal or ureteral calculi. Moderate bilateral hydronephrosis. Urinary bladder is markedly distended with circumferentially thickened walls. Stomach/Bowel: Stomach is within  normal limits. Appendix appears normal. No evidence of bowel wall thickening, distention, or inflammatory changes. Vascular/Lymphatic: No significant vascular findings are present. No enlarged abdominal or pelvic lymph nodes are identified. Reproductive: Markedly enlarged prostate gland measuring approximately 5.9 x 5.4 x 7.4 cm resulting in impress upon the inferior aspect of the urinary bladder. Other: Tiny fat containing umbilical hernia. No abdominopelvic ascites. Musculoskeletal: No acute or significant osseous findings. Degenerative changes of the pubic symphysis, progressed from prior. No lytic or sclerotic osseous lesions are identified. IMPRESSION: 1. Markedly enlarged prostate gland resulting in impress upon the inferior aspect of the urinary bladder. There is associated moderate bilateral hydronephrosis. Correlation with serum PSA and  urology consultation are recommended. 2. Urinary bladder is markedly distended with circumferentially thickened walls. Findings are suggestive of chronic bladder outlet obstruction. 3. Degenerative changes of the pubic symphysis, progressed from prior study. Electronically Signed   By: Davina Poke D.O.   On: 12/12/2019 12:59    Assessment/Plan Active Problems:   Acute renal failure (ARF) (HCC)   Acute renal failure Secondary to hydronephrosis from urinary tract obstruction in setting of BPH. Foley catheter placed (2/18) in the ED. Urology and Nephrology consulted. -Nephrology recommendations: pending -Urology recommendations: pending  Hydronephrosis Secondary to urinary tract obstruction from enlarged prostate -Urology recommendations: pending  Enlarged prostate John Woods has not had any evaluation of his prostate in the past. He states learning about his enlarged prostate just over one year ago but did not follow-up -Urology recommendations: pending -Flomax  Anemia Unknown etiology. Could be a consequence of renal failure if it is more chronic than acute. Normal bilirubin. Normal platelets. No rashes. No known bleeding. -Iron, ferritin, TIBC -CBC in AM  Essential hypertension Hold home lisinopril secondary to above. -Amlodipine 5 mg daily -Hydralazine prn  Tobacco use Counseled. John Woods states he is quitting and has been cutting back.   DVT prophylaxis: Lovenox Code Status: DNR Family Communication: None at bedside Disposition Plan: Telemetry likely for 24-48 hours, then possible transfer to medical floor if kidney function improves and anticipate discharge home when medical recommended by urology and nephrology Consults called: Urology and Nephrology per EDP Admission status: Inpatient   Cordelia Poche, MD [facility] 12/12/2019, 1:47 PM

## 2019-12-12 NOTE — Progress Notes (Signed)
Attempted to call ED for report from [place], [place]. No answer at this time. Will try again in a few minutes.

## 2019-12-12 NOTE — ED Notes (Signed)
Per [place], MD insert coude 16 or 47fr for patient.

## 2019-12-13 DIAGNOSIS — R339 Retention of urine, unspecified: Secondary | ICD-10-CM

## 2019-12-13 LAB — RENAL FUNCTION PANEL
Albumin: 4.6 g/dL (ref 3.5–5.0)
Albumin: 4.1 g/dL (ref 3.5–5.0)
Anion gap: 11 (ref 5–15)
Anion gap: 10 (ref 5–15)
BUN: 55 mg/dL — ABNORMAL HIGH (ref 6–20)
BUN: 52 mg/dL — ABNORMAL HIGH (ref 6–20)
CO2: 22 mmol/L (ref 22–32)
CO2: 27 mmol/L (ref 22–32)
Calcium: 9.5 mg/dL (ref 8.9–10.3)
Calcium: 9.3 mg/dL (ref 8.9–10.3)
Chloride: 107 mmol/L (ref 98–111)
Chloride: 104 mmol/L (ref 98–111)
Creatinine, Ser: 6.94 mg/dL — ABNORMAL HIGH (ref 0.61–1.24)
Creatinine, Ser: 6.37 mg/dL — ABNORMAL HIGH (ref 0.61–1.24)
GFR calc Af Amer: 10 mL/min — ABNORMAL LOW
GFR calc Af Amer: 11 mL/min — ABNORMAL LOW (ref >60)
GFR calc non Af Amer: 8 mL/min — ABNORMAL LOW
GFR calc non Af Amer: 9 mL/min — ABNORMAL LOW (ref >60)
Glucose, Bld: 125 mg/dL — ABNORMAL HIGH (ref 70–99)
Glucose, Bld: 133 mg/dL — ABNORMAL HIGH (ref 70–99)
Phosphorus: 5.4 mg/dL — ABNORMAL HIGH (ref 2.5–4.6)
Phosphorus: 6.2 mg/dL — ABNORMAL HIGH (ref 2.5–4.6)
Potassium: 6.6 mmol/L (ref 3.5–5.1)
Potassium: 4.9 mmol/L (ref 3.5–5.1)
Sodium: 140 mmol/L (ref 135–145)
Sodium: 141 mmol/L (ref 135–145)

## 2019-12-13 LAB — CBC
HCT: 33.5 % — ABNORMAL LOW (ref 39.0–52.0)
Hemoglobin: 11.3 g/dL — ABNORMAL LOW (ref 13.0–17.0)
MCH: 32.1 pg (ref 26.0–34.0)
MCHC: 33.7 g/dL (ref 30.0–36.0)
MCV: 95.2 fL (ref 80.0–100.0)
Platelets: 250 10*3/uL (ref 150–400)
RBC: 3.52 MIL/uL — ABNORMAL LOW (ref 4.22–5.81)
RDW: 11.6 % (ref 11.5–15.5)
WBC: 9.7 10*3/uL (ref 4.0–10.5)
nRBC: 0 % (ref 0.0–0.2)

## 2019-12-13 MED ORDER — SODIUM ZIRCONIUM CYCLOSILICATE 10 G PO PACK
10.0000 g | PACK | Freq: Three times a day (TID) | ORAL | Status: DC
  Administered 2019-12-13 – 2019-12-15 (×8): 10 g via ORAL
  Filled 2019-12-13 (×10): qty 1

## 2019-12-13 MED ORDER — SODIUM CHLORIDE 0.9 % IV BOLUS
1000.0000 mL | Freq: Once | INTRAVENOUS | Status: AC
  Administered 2019-12-13: 1000 mL via INTRAVENOUS

## 2019-12-13 MED ORDER — POLYETHYLENE GLYCOL 3350 17 G PO PACK
17.0000 g | PACK | Freq: Every day | ORAL | Status: DC | PRN
  Administered 2019-12-13 – 2019-12-16 (×2): 17 g via ORAL
  Filled 2019-12-13 (×2): qty 1

## 2019-12-13 MED ORDER — SODIUM BICARBONATE 8.4 % IV SOLN
50.0000 meq | Freq: Once | INTRAVENOUS | Status: AC
  Administered 2019-12-13: 50 meq via INTRAVENOUS
  Filled 2019-12-13: qty 50

## 2019-12-13 MED ORDER — FUROSEMIDE 10 MG/ML IJ SOLN
80.0000 mg | Freq: Once | INTRAMUSCULAR | Status: AC
  Administered 2019-12-13: 06:00:00 80 mg via INTRAVENOUS
  Filled 2019-12-13: qty 8

## 2019-12-13 MED ORDER — SODIUM BICARBONATE 8.4 % IV SOLN: 50.0000 meq | Freq: Once | INTRAVENOUS | Status: DC

## 2019-12-13 MED ORDER — SODIUM CHLORIDE 0.45 % IV SOLN: INTRAVENOUS | Status: AC

## 2019-12-13 MED ORDER — CHLORHEXIDINE GLUCONATE CLOTH 2 % EX PADS
6.0000 | MEDICATED_PAD | Freq: Every day | CUTANEOUS | Status: DC
  Administered 2019-12-13 – 2019-12-18 (×6): 6 via TOPICAL

## 2019-12-13 NOTE — Consult Note (Signed)
Urology Consult  Referring physician: Dr. Darrick Meigs Reason for referral: Urinary retention  Chief Complaint: urinary retention  History of Present Illness: John Woods is a 54yo with a hx of BPH who was admitted with acute renal failure and urinary retention. The patient has had progressive urinary frequency, urgency, nocturia 3-4x, a feeling of incomplete emptying and a weak stream since 2019. He has not sought care with a urologist. He has never been on BPH meds. Starting Thursday his abdominal distention and pelvic pain worsened and he was unable to urinate. No other associated symptoms. No exacerbating/alleviating events. Creatinine was 10 on admission and has improved to 6 today. A foley catheter was placed in the ER which drained 1.5L.   Past Medical History:  Diagnosis Date  . Allergy   . Enlarged prostate   . Hypertension    History reviewed. No pertinent surgical history.  Medications: I have reviewed the patient's current medications. Allergies: No Known Allergies  Family History  Problem Relation Age of Onset  . Cancer Mother   . Cancer Father    Social History:  reports that he has been smoking cigarettes. He has been smoking about 0.50 packs per day. He has never used smokeless tobacco. He reports that he does not drink alcohol or use drugs.  Review of Systems  Gastrointestinal: Positive for abdominal pain.  Genitourinary: Positive for decreased urine volume, difficulty urinating, frequency and urgency.  All other systems reviewed and are negative.   Physical Exam:  Vital signs in last 24 hours: Temp:  [98.1 F (36.7 C)-98.7 F (37.1 C)] 98.1 F (36.7 C) (02/19 1242) Pulse Rate:  [79-107] 96 (02/19 1243) Resp:  [16-19] 17 (02/19 1242) BP: (131-170)/(75-96) 131/75 (02/19 1242) SpO2:  [98 %-100 %] 100 % (02/19 1243) Weight:  [80.2 kg] 80.2 kg (02/19 0175) Physical Exam  Constitutional: He is oriented to person, place, and time. He appears well-developed and  well-nourished.  HENT:  Head: Normocephalic and atraumatic.  Eyes: Pupils are equal, round, and reactive to light. EOM are normal.  Neck: No thyromegaly present.  Cardiovascular: Normal rate and regular rhythm.  Respiratory: Effort normal. No respiratory distress.  GI: Soft. He exhibits no distension. Hernia confirmed negative in the right inguinal area and confirmed negative in the left inguinal area.  Genitourinary:    Testes, penis and rectum normal.  Prostate is enlarged. Circumcised.  Musculoskeletal:        General: No edema. Normal range of motion.     Cervical back: Normal range of motion.  Lymphadenopathy:       Right: No inguinal adenopathy present.       Left: No inguinal adenopathy present.  Neurological: He is alert and oriented to person, place, and time.  Skin: Skin is warm and dry.  Psychiatric: He has a normal mood and affect. His behavior is normal. Judgment and thought content normal.    Laboratory Data:  Results for orders placed or performed during the hospital encounter of 12/12/19 (from the past 72 hour(s))  Urinalysis, Routine w reflex microscopic     Status: Abnormal   Collection Time: 12/12/19 11:10 AM  Result Value Ref Range   Color, Urine STRAW (A) YELLOW   APPearance CLEAR CLEAR   Specific Gravity, Urine 1.006 1.005 - 1.030   pH 6.0 5.0 - 8.0   Glucose, UA NEGATIVE NEGATIVE mg/dL   Hgb urine dipstick MODERATE (A) NEGATIVE   Bilirubin Urine NEGATIVE NEGATIVE   Ketones, ur NEGATIVE NEGATIVE mg/dL   Protein,  ur NEGATIVE NEGATIVE mg/dL   Nitrite NEGATIVE NEGATIVE   Leukocytes,Ua TRACE (A) NEGATIVE   RBC / HPF 11-20 0 - 5 RBC/hpf   WBC, UA 6-10 0 - 5 WBC/hpf   Bacteria, UA NONE SEEN NONE SEEN    Comment: Performed at [facility], [address], [place], [place] [address]  Lipase, blood     Status: None   Collection Time: 12/12/19 11:16 AM  Result Value Ref Range   Lipase 32 11 - 51 U/L    Comment: Performed at [facility], [address], [place], [place] [address]  Comprehensive metabolic panel     Status: Abnormal   Collection Time: 12/12/19 11:16 AM  Result Value Ref Range   Sodium 140 135 - 145 mmol/L   Potassium 5.4 (H) 3.5 - 5.1 mmol/L   Chloride 106 98 - 111 mmol/L   CO2 22 22 - 32 mmol/L   Glucose, Bld 140 (H) 70 - 99 mg/dL   BUN 68 (H) 6 - 20 mg/dL   Creatinine, Ser 76.28 (H) 0.61 - 1.24 mg/dL   Calcium 9.1 8.9 - 31.5 mg/dL   Total Protein 8.2 (H) 6.5 - 8.1 g/dL   Albumin 4.5 3.5 - 5.0 g/dL   AST 16 15 - 41 U/L   ALT 19 0 - 44 U/L   Alkaline Phosphatase 67 38 - 126 U/L   Total Bilirubin 0.9 0.3 - 1.2 mg/dL   GFR calc non Af Amer 5 (L) >60 mL/min   GFR calc Af Amer 6 (L) >60 mL/min   Anion gap 12 5 - 15    Comment: Performed at [facility], [address], [place], [place] [address]  CBC     Status: Abnormal   Collection Time: 12/12/19 11:16 AM  Result Value Ref Range   WBC 7.0 4.0 - 10.5 K/uL   RBC 3.08 (L) 4.22 - 5.81 MIL/uL   Hemoglobin 9.6 (L) 13.0 - 17.0 g/dL   HCT 07.3 (L) 71.0 - 62.6 %   MCV 96.4 80.0 - 100.0 fL   MCH 31.2 26.0 - 34.0 pg   MCHC 32.3 30.0 - 36.0 g/dL   RDW 94.8 54.6 - 27.0 %   Platelets 208 150 - 400 K/uL   nRBC 0.0 0.0 - 0.2 %    Comment: Performed at [facility], [address] [place], [place] [address]  SARS CORONAVIRUS 2 (TAT 6-24 HRS) Nasopharyngeal Nasopharyngeal Swab     Status: None   Collection Time: 12/12/19  1:38 PM   Specimen: Nasopharyngeal Swab  Result Value Ref Range   SARS Coronavirus 2 NEGATIVE NEGATIVE    Comment: (NOTE) SARS-CoV-2 target nucleic acids are NOT DETECTED. The SARS-CoV-2 RNA is generally detectable in upper and lower respiratory specimens during the acute phase of infection. Negative results do not preclude SARS-CoV-2 infection, do not rule out co-infections with other pathogens, and should not be used as the sole basis for treatment or other patient management  decisions. Negative results must be combined with clinical observations, patient history, and epidemiological information. The expected result is Negative. Fact Sheet for Patients: HairSlick.no Fact Sheet for Healthcare Providers: quierodirigir.com This test is not yet approved or cleared by the [place] FDA and  has been authorized for detection and/or diagnosis of SARS-CoV-2 by FDA under an Emergency Use Authorization (EUA). This EUA will remain  in effect (meaning this test can be used) for the duration of the COVID-19 declaration  under Section 56 4(b)(1) of the Act, 21 U.S.C. section 360bbb-3(b)(1), unless the authorization is terminated or revoked sooner. Performed at [facility], [address] [place], [place] [address]   Renal function panel     Status: Abnormal   Collection Time: 12/12/19  4:03 PM  Result Value Ref Range   Sodium 144 135 - 145 mmol/L   Potassium 6.1 (H) 3.5 - 5.1 mmol/L   Chloride 109 98 - 111 mmol/L   CO2 24 22 - 32 mmol/L   Glucose, Bld 119 (H) 70 - 99 mg/dL   BUN 66 (H) 6 - 20 mg/dL   Creatinine, Ser 4.49 (H) 0.61 - 1.24 mg/dL   Calcium 9.2 8.9 - 67.5 mg/dL   Phosphorus 5.2 (H) 2.5 - 4.6 mg/dL   Albumin 4.5 3.5 - 5.0 g/dL   GFR calc non Af Amer 6 (L) >60 mL/min   GFR calc Af Amer 7 (L) >60 mL/min   Anion gap 11 5 - 15    Comment: Performed at [facility], [address] [place], [place] [address]  HIV Antibody (routine testing w rflx)     Status: None   Collection Time: 12/12/19  4:03 PM  Result Value Ref Range   HIV Screen 4th Generation wRfx NON REACTIVE NON REACTIVE    Comment: Performed at [facility], [address], [place], [place] [address]  Iron and TIBC     Status: None   Collection Time: 12/12/19  4:03 PM  Result Value Ref Range   Iron 72 45 - 182 ug/dL   TIBC 935 701 - 779 ug/dL   Saturation Ratios 24 17.9 - 39.5 %   UIBC 225 ug/dL     Comment: Performed at [facility], [address], [place], [place] [address]  Ferritin     Status: Abnormal   Collection Time: 12/12/19  4:03 PM  Result Value Ref Range   Ferritin 509 (H) 24 - 336 ng/mL    Comment: Performed at [facility], [address], [place], [place] [address]  Potassium     Status: Abnormal   Collection Time: 12/12/19  8:00 PM  Result Value Ref Range   Potassium 5.8 (H) 3.5 - 5.1 mmol/L    Comment: Performed at [facility], [address] [place], [place] [address]  Renal function panel     Status: Abnormal   Collection Time: 12/13/19  3:30 AM  Result Value Ref Range   Sodium 140 135 - 145 mmol/L   Potassium 6.6 (HH) 3.5 - 5.1 mmol/L    Comment: NO VISIBLE HEMOLYSIS CRITICAL RESULT CALLED TO, READ BACK BY AND VERIFIED WITH: WILLIAMS,P AT 2633 ON 12/13/2019 BY MOSLEY,    Chloride 107 98 - 111 mmol/L   CO2 22 22 - 32 mmol/L   Glucose, Bld 125 (H) 70 - 99 mg/dL   BUN 55 (H) 6 - 20 mg/dL   Creatinine, Ser 3.54 (H) 0.61 - 1.24 mg/dL   Calcium 9.5 8.9 - 56.2 mg/dL   Phosphorus 5.4 (H) 2.5 - 4.6 mg/dL   Albumin 4.6 3.5 - 5.0 g/dL   GFR calc non Af Amer 8 (L) >60 mL/min   GFR calc Af Amer 10 (L) >60 mL/min   Anion gap 11 5 - 15    Comment: Performed at [facility], [address], [place], [place] [address]  CBC     Status: Abnormal   Collection  Time: 12/13/19  3:30 AM  Result Value Ref Range   WBC 9.7 4.0 - 10.5 K/uL   RBC 3.52 (L) 4.22 - 5.81 MIL/uL   Hemoglobin 11.3 (L) 13.0 - 17.0 g/dL   HCT 87.5 (L) 64.3 - 32.9 %   MCV 95.2 80.0 - 100.0 fL   MCH 32.1 26.0 - 34.0 pg   MCHC 33.7 30.0 - 36.0 g/dL   RDW 51.8 84.1 - 66.0 %   Platelets 250 150 - 400 K/uL   nRBC 0.0 0.0 - 0.2 %    Comment: Performed at [facility], [address], [place], [place] [address]  Renal function panel     Status: Abnormal   Collection Time: 12/13/19 11:14 AM  Result Value Ref  Range   Sodium 141 135 - 145 mmol/L   Potassium 4.9 3.5 - 5.1 mmol/L    Comment: DELTA CHECK NOTED   Chloride 104 98 - 111 mmol/L   CO2 27 22 - 32 mmol/L   Glucose, Bld 133 (H) 70 - 99 mg/dL   BUN 52 (H) 6 - 20 mg/dL   Creatinine, Ser 0.10 (H) 0.61 - 1.24 mg/dL   Calcium 9.3 8.9 - 93.2 mg/dL   Phosphorus 6.2 (H) 2.5 - 4.6 mg/dL   Albumin 4.1 3.5 - 5.0 g/dL   GFR calc non Af Amer 9 (L) >60 mL/min   GFR calc Af Amer 11 (L) >60 mL/min   Anion gap 10 5 - 15    Comment: Performed at [facility], [address] [place], [place] [address]   Recent Results (from the past 240 hour(s))  SARS CORONAVIRUS 2 (TAT 6-24 HRS) Nasopharyngeal Nasopharyngeal Swab     Status: None   Collection Time: 12/12/19  1:38 PM   Specimen: Nasopharyngeal Swab  Result Value Ref Range Status   SARS Coronavirus 2 NEGATIVE NEGATIVE Final    Comment: (NOTE) SARS-CoV-2 target nucleic acids are NOT DETECTED. The SARS-CoV-2 RNA is generally detectable in upper and lower respiratory specimens during the acute phase of infection. Negative results do not preclude SARS-CoV-2 infection, do not rule out co-infections with other pathogens, and should not be used as the sole basis for treatment or other patient management decisions. Negative results must be combined with clinical observations, patient history, and epidemiological information. The expected result is Negative. Fact Sheet for Patients: HairSlick.no Fact Sheet for Healthcare Providers: quierodirigir.com This test is not yet approved or cleared by the [place] FDA and  has been authorized for detection and/or diagnosis of SARS-CoV-2 by FDA under an Emergency Use Authorization (EUA). This EUA will remain  in effect (meaning this test can be used) for the duration of the COVID-19 declaration under Section 56 4(b)(1) of the Act, 21 U.S.C. section 360bbb-3(b)(1), unless the  authorization is terminated or revoked sooner. Performed at [facility], [address] [place], [place] [address]    Creatinine: Recent Labs    12/12/19 1116 12/12/19 1603 12/13/19 0330 12/13/19 1114  CREATININE 10.32* 9.00* 6.94* 6.37*   Baseline Creatinine: unknown  Impression/Assessment:  53yo with BPH with urinary retention  Plan:  I discussed the management of BPH with the patient including a period of bladder rest with an indwelling foley catheter. I also discussed alpha blocker and 5ARI therapy. We have a greed to start flomax 0.4mg  daily. The patient will followup with [facility] 1 week after discharge for voiding trial.   Wilkie Aye 12/13/2019, 4:29 PM

## 2019-12-13 NOTE — Progress Notes (Signed)
Patient requesting medication for spasms. Paged floor coverage.

## 2019-12-13 NOTE — Progress Notes (Signed)
[facility]  PROGRESS NOTE  John Woods VFI:[id number] DOB: 12-Oct-1966 DOA: 12/12/2019 PCP: Patient, No Pcp Per   Brief HPI:   54 year old male with a history of hypertension, tobacco abuse, enlarged prostate who came to ED with complaints of abdominal pain.  He was found to have acute kidney injury with creatinine of 10.32, patient's baseline creatinine 0.80 in March 2018.  Work-up was notable for CT abdomen pelvis showed moderate bilateral hydronephrosis, marked enlarged prostate and circumferentially thickened bladder wall suggestive of chronic bladder outlet obstruction.  Nephrology was consulted, Foley catheter was inserted.    Subjective   Patient seen and examined, this morning creatinine has improved to 6.94.  Potassium was still elevated at 6.6, Lokelma order follow received by on-call nephrologist last night.   Assessment/Plan:     1. Acute kidney injury-patient presented with creatinine of 10.32, baseline creatinine 0.80 as of March 2018.  CT abdomen pelvis showed mildly enlarged prostate, moderate bilateral hydronephrosis.  Nephrology was consulted.  Foley catheter was placed.  Today creatinine has improved to 6.94.  Continue with normal saline 0.45% at 75 mL/h. 2. Bilateral hydronephrosis/prostatomegaly-CT abdomen pelvis shows bilateral hydronephrosis with large prostate.  Patient has Foley catheter and draining well.  Urology has been consulted.  Continue Flomax. 3. Hyperkalemia-potassium was 6.6 this morning, Lokelma 10 g 3 times daily ordered.  Also received 1 ampoule of sodium bicarb this morning.  Follow BMP in a.m. 4. Hypertension-continue amlodipine, hydralazine as needed.  ACE inhibitor is on hold due to renal failure as above.    SpO2: 98 %   COVID-19 Labs  Recent Labs    12/12/19 1603  FERRITIN 509*    Lab Results  Component Value Date   SARSCOV2NAA NEGATIVE 12/12/2019   SARSCOV2NAA NEGATIVE 11/16/2019      CBC: Recent Labs  Lab 12/12/19 1116  12/13/19 0330  WBC 7.0 9.7  HGB 9.6* 11.3*  HCT 29.7* 33.5*  MCV 96.4 95.2  PLT 208 250    Basic Metabolic Panel: Recent Labs  Lab 12/12/19 1116 12/12/19 1603 12/12/19 2000 12/13/19 0330  NA 140 144  --  140  K 5.4* 6.1* 5.8* 6.6*  CL 106 109  --  107  CO2 22 24  --  22  GLUCOSE 140* 119*  --  125*  BUN 68* 66*  --  55*  CREATININE 10.32* 9.00*  --  6.94*  CALCIUM 9.1 9.2  --  9.5  PHOS  --  5.2*  --  5.4*     Liver Function Tests: Recent Labs  Lab 12/12/19 1116 12/12/19 1603 12/13/19 0330  AST 16  --   --   ALT 19  --   --   ALKPHOS 67  --   --   BILITOT 0.9  --   --   PROT 8.2*  --   --   ALBUMIN 4.5 4.5 4.6        DVT prophylaxis: Heparin  Code Status: Full code  Family Communication: No family at bedside  Disposition Plan: Patient with acute kidney injury due to enlarged prostate, currently improving with Foley catheter placement.  Will need few more days of hospitalization for monitoring renal function closely.  Will likely go home with Foley catheter once renal function improves and okay with nephrology.       Scheduled medications:  . amLODipine  5 mg Oral Daily  . Chlorhexidine Gluconate Cloth  6 each Topical Daily  . heparin  5,000 Units Subcutaneous Q8H  .  sodium zirconium cyclosilicate  10 g Oral TID  . tamsulosin  0.4 mg Oral Daily    Consultants:  Nephrology  Procedures:    Antibiotics:   Anti-infectives (From admission, onward)   None       Objective   Vitals:   12/12/19 1700 12/12/19 1941 12/13/19 0507 12/13/19 0611  BP: (!) 170/90 (!) 158/96 (!) 153/85   Pulse: 79 92 87   Resp: 16 19 18    Temp:  98.7 F (37.1 C) 98.3 F (36.8 C)   TempSrc:  Oral Oral   SpO2: 100% 98% 98%   Weight:    80.2 kg  Height:        Intake/Output Summary (Last 24 hours) at 12/13/2019 1126 Last data filed at 12/13/2019 0850 Gross per 24 hour  Intake 3719.08 ml  Output 9400 ml  Net -5680.92 ml    02/17 1901 - 02/19 0700 In:  3719.1 [P.O.:600; I.V.:921.8] Out: 7300 [Urine:7300]  Filed Weights   12/12/19 1259 12/13/19 0611  Weight: 99.8 kg 80.2 kg    Physical Examination:    General-appears in no acute distress  Heart-S1-S2, regular, no murmur auscultated  Lungs-clear to auscultation bilaterally, no wheezing or crackles auscultated  Abdomen-soft, nontender, no organomegaly  Extremities-no edema in the lower extremities  Neuro-alert, oriented x3, no focal deficit noted    Data Reviewed:   Recent Results (from the past 240 hour(s))  SARS CORONAVIRUS 2 (TAT 6-24 HRS) Nasopharyngeal Nasopharyngeal Swab     Status: None   Collection Time: 12/12/19  1:38 PM   Specimen: Nasopharyngeal Swab  Result Value Ref Range Status   SARS Coronavirus 2 NEGATIVE NEGATIVE Final    Comment: (NOTE) SARS-CoV-2 target nucleic acids are NOT DETECTED. The SARS-CoV-2 RNA is generally detectable in upper and lower respiratory specimens during the acute phase of infection. Negative results do not preclude SARS-CoV-2 infection, do not rule out co-infections with other pathogens, and should not be used as the sole basis for treatment or other patient management decisions. Negative results must be combined with clinical observations, patient history, and epidemiological information. The expected result is Negative. Fact Sheet for Patients: 12/14/19 Fact Sheet for Healthcare Providers: HairSlick.no This test is not yet approved or cleared by the [place]ir[place]ir.com FDA and  has been authorized for detection and/or diagnosis of SARS-CoV-2 by FDA under an Emergency Use Authorization (EUA). This EUA will remain  in effect (meaning this test can be used) for the duration of the COVID-19 declaration under Section 56 4(b)(1) of the Act, 21 U.S.C. section 360bbb-3(b)(1), unless the authorization is terminated or revoked sooner. Performed at [facility], [address], [place], [place]     Recent Labs  Lab 12/12/19 1116  LIPASE 32   No results for input(s): AMMONIA in the last 168 hours.  Cardiac Enzymes: No results for input(s): CKTOTAL, CKMB, CKMBINDEX, TROPONINI in the last 168 hours. BNP (last 3 results) No results for input(s): BNP in the last 8760 hours.  ProBNP (last 3 results) No results for input(s): PROBNP in the last 8760 hours.  Studies:  CT ABDOMEN PELVIS WO CONTRAST  Result Date: 12/12/2019 CLINICAL DATA:  Abdominal pain for 1 year EXAM: CT ABDOMEN AND PELVIS WITHOUT CONTRAST TECHNIQUE: Multidetector CT imaging of the abdomen and pelvis was performed following the standard protocol without IV contrast. COMPARISON:  12/22/2016 FINDINGS: Lower chest: No acute abnormality. Hepatobiliary: No focal liver abnormality identified. Unchanged tiny calcification which is either within the gallbladder lumen  or immediately adjacent to the gallbladder. Gallbladder is not dilated. No biliary dilatation. Pancreas: Unremarkable. No pancreatic ductal dilatation or surrounding inflammatory changes. Spleen: Normal in size without focal abnormality. Adrenals/Urinary Tract: Unremarkable adrenal glands. Unchanged midpole cyst at the left kidney measuring approximately 3.1 cm. No renal or ureteral calculi. Moderate bilateral hydronephrosis. Urinary bladder is markedly distended with circumferentially thickened walls. Stomach/Bowel: Stomach is within normal limits. Appendix appears normal. No evidence of bowel wall thickening, distention, or inflammatory changes. Vascular/Lymphatic: No significant vascular findings are present. No enlarged abdominal or pelvic lymph nodes are identified. Reproductive: Markedly enlarged prostate gland measuring approximately 5.9 x 5.4 x 7.4 cm resulting in impress upon the inferior aspect of the urinary bladder. Other: Tiny fat containing umbilical hernia. No abdominopelvic ascites. Musculoskeletal: No acute or  significant osseous findings. Degenerative changes of the pubic symphysis, progressed from prior. No lytic or sclerotic osseous lesions are identified. IMPRESSION: 1. Markedly enlarged prostate gland resulting in impress upon the inferior aspect of the urinary bladder. There is associated moderate bilateral hydronephrosis. Correlation with serum PSA and urology consultation are recommended. 2. Urinary bladder is markedly distended with circumferentially thickened walls. Findings are suggestive of chronic bladder outlet obstruction. 3. Degenerative changes of the pubic symphysis, progressed from prior study. Electronically Signed   By: Davina Poke D.O.   On: 12/12/2019 12:59     Admission status: Inpatient: Based on patients clinical presentation and evaluation of above clinical data, I have made determination that patient meets Inpatient criteria at this time.   Oswald Hillock   [facility] If 7PM-7AM, please contact night-coverage at [email or web address], Office  [phone]   12/13/2019, 11:26 AM  LOS: 1 day

## 2019-12-13 NOTE — Progress Notes (Signed)
Critical potassium 6.6. Paged floor coverage.

## 2019-12-13 NOTE — Progress Notes (Signed)
[facility] Progress Note    Assessment/ Plan:   # Acute kidney injury: improving, having post-obstructive diuresis now.  Nearly 7 L UOP.  Continue 1/ NS, hold lisionpril and NSAIDs.  # Prostatomegaly with obstruction - Foley placement, urology consult, and Flomax  # Hypertension: amlodipine  # Hyperkalemia: s/p aggressive rx, getting lokelma 3x daily--> K now normalized.   # Anemia normocytic: no indication for ESA  Subjective:    Seen in room.  Foley draining a good bit of urine. K normalized after aggressive treatment.     Objective:   BP 131/75 (BP Location: Left Arm)   Pulse 96   Temp 98.1 F (36.7 C) (Oral)   Resp 17   Ht 6\' 3"  (1.905 m)   Wt 80.2 kg   SpO2 100%   BMI 22.10 kg/m   Intake/Output Summary (Last 24 hours) at 12/13/2019 1751 Last data filed at 12/13/2019 1531 Gross per 24 hour  Intake 2248.41 ml  Output 6900 ml  Net -4651.59 ml   Weight change:   Physical Exam: Gen: NAD CVS: RRR Resp: clear bilaterally no c/w/r Abd: soft, no distention GU: Foley draining rosy colored urine Ext: no LE edema  Imaging: CT ABDOMEN PELVIS WO CONTRAST  Result Date: 12/12/2019 CLINICAL DATA:  Abdominal pain for 1 year EXAM: CT ABDOMEN AND PELVIS WITHOUT CONTRAST TECHNIQUE: Multidetector CT imaging of the abdomen and pelvis was performed following the standard protocol without IV contrast. COMPARISON:  12/22/2016 FINDINGS: Lower chest: No acute abnormality. Hepatobiliary: No focal liver abnormality identified. Unchanged tiny calcification which is either within the gallbladder lumen or immediately adjacent to the gallbladder. Gallbladder is not dilated. No biliary dilatation. Pancreas: Unremarkable. No pancreatic ductal dilatation or surrounding inflammatory changes. Spleen: Normal in size without focal abnormality. Adrenals/Urinary Tract: Unremarkable adrenal glands. Unchanged midpole cyst at the left kidney measuring approximately 3.1 cm. No renal or  ureteral calculi. Moderate bilateral hydronephrosis. Urinary bladder is markedly distended with circumferentially thickened walls. Stomach/Bowel: Stomach is within normal limits. Appendix appears normal. No evidence of bowel wall thickening, distention, or inflammatory changes. Vascular/Lymphatic: No significant vascular findings are present. No enlarged abdominal or pelvic lymph nodes are identified. Reproductive: Markedly enlarged prostate gland measuring approximately 5.9 x 5.4 x 7.4 cm resulting in impress upon the inferior aspect of the urinary bladder. Other: Tiny fat containing umbilical hernia. No abdominopelvic ascites. Musculoskeletal: No acute or significant osseous findings. Degenerative changes of the pubic symphysis, progressed from prior. No lytic or sclerotic osseous lesions are identified. IMPRESSION: 1. Markedly enlarged prostate gland resulting in impress upon the inferior aspect of the urinary bladder. There is associated moderate bilateral hydronephrosis. Correlation with serum PSA and urology consultation are recommended. 2. Urinary bladder is markedly distended with circumferentially thickened walls. Findings are suggestive of chronic bladder outlet obstruction. 3. Degenerative changes of the pubic symphysis, progressed from prior study. Electronically Signed   By: Davina Poke D.O.   On: 12/12/2019 12:59    Labs: BMET Recent Labs  Lab 12/12/19 1116 12/12/19 1603 12/12/19 2000 12/13/19 0330 12/13/19 1114  NA 140 144  --  140 141  K 5.4* 6.1* 5.8* 6.6* 4.9  CL 106 109  --  107 104  CO2 22 24  --  22 27  GLUCOSE 140* 119*  --  125* 133*  BUN 68* 66*  --  55* 52*  CREATININE 10.32* 9.00*  --  6.94* 6.37*  CALCIUM 9.1 9.2  --  9.5 9.3  PHOS  --  5.2*  --  5.4* 6.2*   CBC Recent Labs  Lab 12/12/19 1116 12/13/19 0330  WBC 7.0 9.7  HGB 9.6* 11.3*  HCT 29.7* 33.5*  MCV 96.4 95.2  PLT 208 250    Medications:    . amLODipine  5 mg Oral Daily  . Chlorhexidine  Gluconate Cloth  6 each Topical Daily  . heparin  5,000 Units Subcutaneous Q8H  . sodium zirconium cyclosilicate  10 g Oral TID  . tamsulosin  0.4 mg Oral Daily      Bufford Buttner, MD 12/13/2019, 5:51 PM

## 2019-12-14 LAB — RENAL FUNCTION PANEL
Albumin: 3.9 g/dL (ref 3.5–5.0)
Anion gap: 11 (ref 5–15)
BUN: 53 mg/dL — ABNORMAL HIGH (ref 6–20)
CO2: 25 mmol/L (ref 22–32)
Calcium: 8.8 mg/dL — ABNORMAL LOW (ref 8.9–10.3)
Chloride: 103 mmol/L (ref 98–111)
Creatinine, Ser: 5.13 mg/dL — ABNORMAL HIGH (ref 0.61–1.24)
GFR calc Af Amer: 14 mL/min — ABNORMAL LOW (ref >60)
GFR calc non Af Amer: 12 mL/min — ABNORMAL LOW (ref >60)
Glucose, Bld: 90 mg/dL (ref 70–99)
Phosphorus: 6.1 mg/dL — ABNORMAL HIGH (ref 2.5–4.6)
Potassium: 5.1 mmol/L (ref 3.5–5.1)
Sodium: 139 mmol/L (ref 135–145)

## 2019-12-14 MED ORDER — SODIUM CHLORIDE 0.9 % IV BOLUS
1000.0000 mL | Freq: Once | INTRAVENOUS | Status: AC
  Administered 2019-12-14: 13:00:00 1000 mL via INTRAVENOUS

## 2019-12-14 NOTE — Plan of Care (Signed)
Pt alert and oriented x 4 denies pain states no muscle spasms since last night. Educated to inform nurse of onset. Has urinary catheter in place with pink tinged urine in bag. Continent of bowel LBM on 12/12/19. Standby assist for mobility in room. Steady gait. Able to turn self in bed No skin concerns at this time. Appetite good. On fluid restriction. Remains fall/injury free demonstrates how to use call bell, bed low skid resistant socks present. Will continue to monitor

## 2019-12-14 NOTE — Progress Notes (Signed)
[facility]  PROGRESS NOTE  John Woods CWC:[id number] DOB: 07-May-1966 DOA: 12/12/2019 PCP: Patient, No Pcp Per   Brief HPI:   54 year old male with a history of hypertension, tobacco abuse, enlarged prostate who came to ED with complaints of abdominal pain.  He was found to have acute kidney injury with creatinine of 10.32, patient's baseline creatinine 0.80 in March 2018.  Work-up was notable for CT abdomen pelvis showed moderate bilateral hydronephrosis, marked enlarged prostate and circumferentially thickened bladder wall suggestive of chronic bladder outlet obstruction.  Nephrology was consulted, Foley catheter was inserted.    Subjective   Patient seen and examined, denies chest pain or shortness of breath.  Renal function is slowly improving.  Appreciate urology input.   Assessment/Plan:     1. Acute kidney injury-patient presented with creatinine of 10.32, baseline creatinine 0.80 as of March 2018.  CT abdomen pelvis showed mildly enlarged prostate, moderate bilateral hydronephrosis.  Nephrology was consulted.  Foley catheter was placed.  Today creatinine has improved to 5.13.  Continue with normal saline 0.45% at 75 mL/h. 2. Bilateral hydronephrosis/prostatomegaly-CT abdomen pelvis shows bilateral hydronephrosis with large prostate.  Patient has Foley catheter and draining well.  Urology has seen the patient.  Continue Flomax.  Patient will be discharged with Foley catheter, and follow-up urology as outpatient. 3. Hyperkalemia-resolved, patient received Lokelma yesterday. 4. Hypertension-continue amlodipine, hydralazine as needed.  ACE inhibitor is on hold due to renal failure as above.    SpO2: 95 %   COVID-19 Labs  Recent Labs    12/12/19 1603  FERRITIN 509*    Lab Results  Component Value Date   SARSCOV2NAA NEGATIVE 12/12/2019   [place] NEGATIVE 11/16/2019      CBC: Recent Labs  Lab 12/12/19 1116 12/13/19 0330  WBC 7.0 9.7  HGB 9.6* 11.3*  HCT  29.7* 33.5*  MCV 96.4 95.2  PLT 208 761    Basic Metabolic Panel: Recent Labs  Lab 12/12/19 1116 12/12/19 1116 12/12/19 1603 12/12/19 2000 12/13/19 0330 12/13/19 1114 12/14/19 0316  NA 140  --  144  --  140 141 139  K 5.4*   < > 6.1* 5.8* 6.6* 4.9 5.1  CL 106  --  109  --  107 104 103  CO2 22  --  24  --  22 27 25   GLUCOSE 140*  --  119*  --  125* 133* 90  BUN 68*  --  66*  --  55* 52* 53*  CREATININE 10.32*  --  9.00*  --  6.94* 6.37* 5.13*  CALCIUM 9.1  --  9.2  --  9.5 9.3 8.8*  PHOS  --   --  5.2*  --  5.4* 6.2* 6.1*   < > = values in this interval not displayed.     Liver Function Tests: Recent Labs  Lab 12/12/19 1116 12/12/19 1603 12/13/19 0330 12/13/19 1114 12/14/19 0316  AST 16  --   --   --   --   ALT 19  --   --   --   --   ALKPHOS 67  --   --   --   --   BILITOT 0.9  --   --   --   --   PROT 8.2*  --   --   --   --   ALBUMIN 4.5 4.5 4.6 4.1 3.9        DVT prophylaxis: Heparin  Code Status: Full code  Family Communication: No  family at bedside  Disposition Plan: Patient with acute kidney injury due to enlarged prostate, currently improving with Foley catheter placement.  Will need few more days of hospitalization for monitoring renal function closely.  Will likely go home with Foley catheter once renal function improves and okay with nephrology.       Scheduled medications:  . amLODipine  5 mg Oral Daily  . Chlorhexidine Gluconate Cloth  6 each Topical Daily  . heparin  5,000 Units Subcutaneous Q8H  . sodium zirconium cyclosilicate  10 g Oral TID  . tamsulosin  0.4 mg Oral Daily    Consultants:  Nephrology  Procedures:    Antibiotics:   Anti-infectives (From admission, onward)   None       Objective   Vitals:   12/13/19 1243 12/13/19 2010 12/14/19 0500 12/14/19 0513  BP:  (!) 145/88  133/78  Pulse: 96 (!) 102  82  Resp:  20  18  Temp:  98.6 F (37 C)  98.7 F (37.1 C)  TempSrc:  Oral  Oral  SpO2: 100% 99%  95%   Weight:   80.2 kg   Height:        Intake/Output Summary (Last 24 hours) at 12/14/2019 1020 Last data filed at 12/14/2019 5945 Gross per 24 hour  Intake 769.33 ml  Output 3650 ml  Net -2880.67 ml    02/18 1901 - 02/20 0700 In: 1808.7 [P.O.:480; I.V.:1328.7] Out: 8250 [Urine:8250]  Filed Weights   12/12/19 1259 12/13/19 0611 12/14/19 0500  Weight: 99.8 kg 80.2 kg 80.2 kg    Physical Examination:   General-appears in no acute distress Heart-S1-S2, regular, no murmur auscultated Lungs-clear to auscultation bilaterally, no wheezing or crackles auscultated Abdomen-soft, nontender, no organomegaly Extremities-no edema in the lower extremities Neuro-alert, oriented x3, no focal deficit noted   Data Reviewed:   Recent Results (from the past 240 hour(s))  SARS CORONAVIRUS 2 (TAT 6-24 HRS) Nasopharyngeal Nasopharyngeal Swab     Status: None   Collection Time: 12/12/19  1:38 PM   Specimen: Nasopharyngeal Swab  Result Value Ref Range Status   SARS Coronavirus 2 NEGATIVE NEGATIVE Final    Comment: (NOTE) SARS-CoV-2 target nucleic acids are NOT DETECTED. The SARS-CoV-2 RNA is generally detectable in upper and lower respiratory specimens during the acute phase of infection. Negative results do not preclude SARS-CoV-2 infection, do not rule out co-infections with other pathogens, and should not be used as the sole basis for treatment or other patient management decisions. Negative results must be combined with clinical observations, patient history, and epidemiological information. The expected result is Negative. Fact Sheet for Patients: HairSlick.no Fact Sheet for Healthcare Providers: quierodirigir.com This test is not yet approved or cleared by the [place] FDA and  has been authorized for detection and/or diagnosis of SARS-CoV-2 by FDA under an Emergency Use Authorization (EUA). This EUA will remain  in effect  (meaning this test can be used) for the duration of the COVID-19 declaration under Section 56 4(b)(1) of the Act, 21 U.S.C. section 360bbb-3(b)(1), unless the authorization is terminated or revoked sooner. Performed at [facility], [address] [place], [place] [address]     Recent Labs  Lab 12/12/19 1116  LIPASE 32   No results for input(s): AMMONIA in the last 168 hours.  Cardiac Enzymes: No results for input(s): CKTOTAL, CKMB, CKMBINDEX, TROPONINI in the last 168 hours. BNP (last 3 results) No results for input(s): BNP in the last 8760 hours.  ProBNP (last  3 results) No results for input(s): PROBNP in the last 8760 hours.  Studies:  CT ABDOMEN PELVIS WO CONTRAST  Result Date: 12/12/2019 CLINICAL DATA:  Abdominal pain for 1 year EXAM: CT ABDOMEN AND PELVIS WITHOUT CONTRAST TECHNIQUE: Multidetector CT imaging of the abdomen and pelvis was performed following the standard protocol without IV contrast. COMPARISON:  12/22/2016 FINDINGS: Lower chest: No acute abnormality. Hepatobiliary: No focal liver abnormality identified. Unchanged tiny calcification which is either within the gallbladder lumen or immediately adjacent to the gallbladder. Gallbladder is not dilated. No biliary dilatation. Pancreas: Unremarkable. No pancreatic ductal dilatation or surrounding inflammatory changes. Spleen: Normal in size without focal abnormality. Adrenals/Urinary Tract: Unremarkable adrenal glands. Unchanged midpole cyst at the left kidney measuring approximately 3.1 cm. No renal or ureteral calculi. Moderate bilateral hydronephrosis. Urinary bladder is markedly distended with circumferentially thickened walls. Stomach/Bowel: Stomach is within normal limits. Appendix appears normal. No evidence of bowel wall thickening, distention, or inflammatory changes. Vascular/Lymphatic: No significant vascular findings are present. No enlarged abdominal or pelvic lymph nodes are identified. Reproductive:  Markedly enlarged prostate gland measuring approximately 5.9 x 5.4 x 7.4 cm resulting in impress upon the inferior aspect of the urinary bladder. Other: Tiny fat containing umbilical hernia. No abdominopelvic ascites. Musculoskeletal: No acute or significant osseous findings. Degenerative changes of the pubic symphysis, progressed from prior. No lytic or sclerotic osseous lesions are identified. IMPRESSION: 1. Markedly enlarged prostate gland resulting in impress upon the inferior aspect of the urinary bladder. There is associated moderate bilateral hydronephrosis. Correlation with serum PSA and urology consultation are recommended. 2. Urinary bladder is markedly distended with circumferentially thickened walls. Findings are suggestive of chronic bladder outlet obstruction. 3. Degenerative changes of the pubic symphysis, progressed from prior study. Electronically Signed   By: Duanne Guess D.O.   On: 12/12/2019 12:59     Admission status: Inpatient: Based on patients clinical presentation and evaluation of above clinical data, I have made determination that patient meets Inpatient criteria at this time.   Meredeth Ide   [facility] If 7PM-7AM, please contact night-coverage at [email or web address], Office  [phone]   12/14/2019, 10:20 AM  LOS: 2 days

## 2019-12-14 NOTE — Progress Notes (Signed)
  [facility] Progress Note    Assessment/ Plan:   # Acute kidney injury: improving, having post-obstructive diuresis now.  Nearly 7 L UOP.  Continue 1/ NS, hold lisionpril and NSAIDs.  Will bolus 1L NS today, will look at labs tomorrow and see if we can decrease IVFs  # Prostatomegaly with obstruction - Foley placement, urology consult, and Flomax  # Hypertension: amlodipine  # Hyperkalemia: s/p aggressive rx, getting lokelma 3x daily--> K now normalized, will continue lokelma until K around 4  # Anemia normocytic: no indication for ESA  Subjective:    BUN and Cr improve.  UOP 5.3L.  K better at 5.1.   Objective:   BP 133/78 (BP Location: Left Arm)   Pulse 82   Temp 98.7 F (37.1 C) (Oral)   Resp 18   Ht 6\' 3"  (1.905 m)   Wt 80.2 kg   SpO2 95%   BMI 22.10 kg/m   Intake/Output Summary (Last 24 hours) at 12/14/2019 1253 Last data filed at 12/14/2019 12/16/2019 Gross per 24 hour  Intake 769.33 ml  Output 2450 ml  Net -1680.67 ml   Weight change: -19.6 kg  Physical Exam: Gen: NAD CVS: RRR Resp: clear bilaterally no c/w/r Abd: soft, no distention GU: Foley draining rosy colored urine Ext: no LE edema  Imaging: No results found.  Labs: BMET Recent Labs  Lab 12/12/19 1116 12/12/19 1603 12/12/19 2000 12/13/19 0330 12/13/19 1114 12/14/19 0316  NA 140 144  --  140 141 139  K 5.4* 6.1* 5.8* 6.6* 4.9 5.1  CL 106 109  --  107 104 103  CO2 22 24  --  22 27 25   GLUCOSE 140* 119*  --  125* 133* 90  BUN 68* 66*  --  55* 52* 53*  CREATININE 10.32* 9.00*  --  6.94* 6.37* 5.13*  CALCIUM 9.1 9.2  --  9.5 9.3 8.8*  PHOS  --  5.2*  --  5.4* 6.2* 6.1*   CBC Recent Labs  Lab 12/12/19 1116 12/13/19 0330  WBC 7.0 9.7  HGB 9.6* 11.3*  HCT 29.7* 33.5*  MCV 96.4 95.2  PLT 208 250    Medications:    . amLODipine  5 mg Oral Daily  . Chlorhexidine Gluconate Cloth  6 each Topical Daily  . heparin  5,000 Units Subcutaneous Q8H  . sodium zirconium  cyclosilicate  10 g Oral TID  . tamsulosin  0.4 mg Oral Daily      12/14/19, MD 12/14/2019, 12:53 PM

## 2019-12-14 NOTE — Progress Notes (Signed)
Patient ID: John Woods, male   DOB: 12/17/1965, 54 y.o.   MRN: [id number]    Subjective: Pt c/o constipation.  Objective: Vital signs in last 24 hours: Temp:  [98.1 F (36.7 C)-98.7 F (37.1 C)] 98.7 F (37.1 C) (02/20 0513) Pulse Rate:  [82-107] 82 (02/20 0513) Resp:  [17-20] 18 (02/20 0513) BP: (131-145)/(75-88) 133/78 (02/20 0513) SpO2:  [95 %-100 %] 95 % (02/20 0513) Weight:  [80.2 kg] 80.2 kg (02/20 0500)  Intake/Output from previous day: 02/19 0701 - 02/20 0700 In: 529.3 [I.V.:529.3] Out: 5350 [Urine:5350] Intake/Output this shift: Total I/O In: -  Out: 400 [Urine:400]  Physical Exam:  General: Alert and oriented GU: Urine clear, draining well  Lab Results: Recent Labs    12/12/19 1116 12/13/19 0330  HGB 9.6* 11.3*  HCT 29.7* 33.5*   BMET Recent Labs    12/13/19 1114 12/14/19 0316  NA 141 139  K 4.9 5.1  CL 104 103  CO2 27 25  GLUCOSE 133* 90  BUN 52* 53*  CREATININE 6.37* 5.13*  CALCIUM 9.3 8.8*   Lab Results  Component Value Date   CREATININE 5.13 (H) 12/14/2019   CREATININE 6.37 (H) 12/13/2019   CREATININE 6.94 (H) 12/13/2019     Studies/Results:  Assessment/Plan: 1) Urinary retention with AKI: Continue urethral catheter drainage and monitor for postobstructive diuresis complications.  Baseline Cr was 0.8 in 2018. Will need d/c with catheter with outpatient urology follow up with Dr. Ronne Binning.     LOS: 2 days   Crecencio Mc 12/14/2019, 8:55 AM

## 2019-12-15 LAB — RENAL FUNCTION PANEL
Albumin: 3.8 g/dL (ref 3.5–5.0)
Anion gap: 8 (ref 5–15)
BUN: 44 mg/dL — ABNORMAL HIGH (ref 6–20)
CO2: 25 mmol/L (ref 22–32)
Calcium: 9.1 mg/dL (ref 8.9–10.3)
Chloride: 105 mmol/L (ref 98–111)
Creatinine, Ser: 3.68 mg/dL — ABNORMAL HIGH (ref 0.61–1.24)
GFR calc Af Amer: 21 mL/min — ABNORMAL LOW (ref >60)
GFR calc non Af Amer: 18 mL/min — ABNORMAL LOW (ref >60)
Glucose, Bld: 100 mg/dL — ABNORMAL HIGH (ref 70–99)
Phosphorus: 4.6 mg/dL (ref 2.5–4.6)
Potassium: 4.6 mmol/L (ref 3.5–5.1)
Sodium: 138 mmol/L (ref 135–145)

## 2019-12-15 MED ORDER — SODIUM ZIRCONIUM CYCLOSILICATE 10 G PO PACK
10.0000 g | PACK | Freq: Every day | ORAL | Status: DC
  Administered 2019-12-16 – 2019-12-17 (×2): 10 g via ORAL
  Filled 2019-12-15 (×2): qty 1

## 2019-12-15 MED ORDER — BELLADONNA ALKALOIDS-OPIUM 16.2-60 MG RE SUPP
1.0000 | Freq: Three times a day (TID) | RECTAL | Status: DC | PRN
  Administered 2019-12-16: 10:00:00 1 via RECTAL
  Filled 2019-12-15 (×2): qty 1

## 2019-12-15 NOTE — Progress Notes (Signed)
  John Woods Progress Note    Assessment/ Plan:   # Acute kidney injury: improving, having post-obstructive diuresis now.  Nearly 7 L UOP.  Continue 1/ NS, hold lisionpril and NSAIDs.  Will trial off IVFs and see if can maintain hydration status with PO intake/ follow urine output.  If still has polyuria and not able to maintain PO intake will need IVFs back on until can do so.  # Prostatomegaly with obstruction - Foley placement, urology consult, and Flomax  # Hypertension: amlodipine  # Hyperkalemia: s/p aggressive rx, getting lokelma 3x daily--> K now normalized, decrease lokelma to daily and anticipate being able to stop it tomorrow.   # Anemia normocytic: no indication for ESA  Will sign off- call with questions.  Subjective:    CR and BUN improving, K better.  Still robust urine output.      Objective:   BP 119/74 (BP Location: Left Arm)   Pulse 87   Temp 98.1 F (36.7 C) (Oral)   Resp 17   Ht 6\' 3"  (1.905 m)   Wt 84.2 kg   SpO2 100%   BMI 23.20 kg/m   Intake/Output Summary (Last 24 hours) at 12/15/2019 1423 Last data filed at 12/15/2019 12/17/2019 Gross per 24 hour  Intake 840 ml  Output 6250 ml  Net -5410 ml   Weight change: 4 kg  Physical Exam: Gen: NAD CVS: RRR Resp: clear bilaterally no c/w/r Abd: soft, no distention GU: Foley draining rosy colored urine Ext: no LE edema  Imaging: No results found.  Labs: BMET Recent Labs  Lab 12/12/19 1116 12/12/19 1603 12/12/19 2000 12/13/19 0330 12/13/19 1114 12/14/19 0316 12/15/19 0421  NA 140 144  --  140 141 139 138  K 5.4* 6.1* 5.8* 6.6* 4.9 5.1 4.6  CL 106 109  --  107 104 103 105  CO2 22 24  --  22 27 25 25   GLUCOSE 140* 119*  --  125* 133* 90 100*  BUN 68* 66*  --  55* 52* 53* 44*  CREATININE 10.32* 9.00*  --  6.94* 6.37* 5.13* 3.68*  CALCIUM 9.1 9.2  --  9.5 9.3 8.8* 9.1  PHOS  --  5.2*  --  5.4* 6.2* 6.1* 4.6   CBC Recent Labs  Lab 12/12/19 1116 12/13/19 0330  WBC 7.0  9.7  HGB 9.6* 11.3*  HCT 29.7* 33.5*  MCV 96.4 95.2  PLT 208 250    Medications:    . amLODipine  5 mg Oral Daily  . Chlorhexidine Gluconate Cloth  6 each Topical Daily  . heparin  5,000 Units Subcutaneous Q8H  . [START ON 12/16/2019] sodium zirconium cyclosilicate  10 g Oral Daily  . tamsulosin  0.4 mg Oral Daily      12/15/19, MD 12/15/2019, 2:23 PM

## 2019-12-15 NOTE — Progress Notes (Signed)
Patient ID: John Woods, male   DOB: May 28, 1966, 54 y.o.   MRN: [id number]    Subjective: Doing well. C/O bladder spasms.  Objective: Vital signs in last 24 hours: Temp:  [98.4 F (36.9 C)-98.8 F (37.1 C)] 98.5 F (36.9 C) (02/21 0616) Pulse Rate:  [81-96] 81 (02/21 0616) Resp:  [15-20] 18 (02/21 0616) BP: (146-147)/(84-94) 146/94 (02/21 0616) SpO2:  [98 %-100 %] 98 % (02/21 0616) Weight:  [84.2 kg] 84.2 kg (02/21 0612)  Intake/Output from previous day: 02/20 0701 - 02/21 0700 In: 1080 [P.O.:1080] Out: 6650 [Urine:6650] Intake/Output this shift: No intake/output data recorded.  Physical Exam:  General: Alert and oriented GU: Urine clear  Lab Results: Recent Labs    12/12/19 1116 12/13/19 0330  HGB 9.6* 11.3*  HCT 29.7* 33.5*   CBC Latest Ref Rng & Units 12/13/2019 12/12/2019 12/22/2016  WBC 4.0 - 10.5 K/uL 9.7 7.0 -  Hemoglobin 13.0 - 17.0 g/dL 11.3(L) 9.6(L) 15.0  Hematocrit 39.0 - 52.0 % 33.5(L) 29.7(L) 44.0  Platelets 150 - 400 K/uL 250 208 -     BMET Recent Labs    12/14/19 0316 12/15/19 0421  NA 139 138  K 5.1 4.6  CL 103 105  CO2 25 25  GLUCOSE 90 100*  BUN 53* 44*  CREATININE 5.13* 3.68*  CALCIUM 8.8* 9.1   Lab Results  Component Value Date   CREATININE 3.68 (H) 12/15/2019   CREATININE 5.13 (H) 12/14/2019   CREATININE 6.37 (H) 12/13/2019     Studies/Results: No results found.  Assessment/Plan: 1) Urinary retention/AKI: Renal function continues to improve.  Continue urethral catheter drainage. Follow up as outpatient with Dr. Ronne Binning for further outpatient evaluation and management of bladder outlet obstruction. 2) Bladder spasms: Will order B&O suppositories for breakthrough bladder spasm discomfort.   LOS: 3 days   Crecencio Mc 12/15/2019, 8:03 AM

## 2019-12-15 NOTE — Progress Notes (Signed)
[facility]  PROGRESS NOTE  DOC MANDALA KYH:[id number] DOB: April 25, 1966 DOA: 12/12/2019 PCP: Patient, No Pcp Per   Brief HPI:   54 year old male with a history of hypertension, tobacco abuse, enlarged prostate who came to ED with complaints of abdominal pain.  He was found to have acute kidney injury with creatinine of 10.32, patient's baseline creatinine 0.80 in March 2018.  Work-up was notable for CT abdomen pelvis showed moderate bilateral hydronephrosis, marked enlarged prostate and circumferentially thickened bladder wall suggestive of chronic bladder outlet obstruction.  Nephrology was consulted, Foley catheter was inserted.    Subjective   Patient seen and examined, complains of abdominal discomfort.  Started on B&O suppository for breakthrough bladder spasm discomfort.  Renal function is improving.   Assessment/Plan:     1. Acute kidney injury-patient presented with creatinine of 10.32, baseline creatinine 0.80 as of March 2018.  CT abdomen pelvis showed mildly enlarged prostate, moderate bilateral hydronephrosis.  Nephrology was consulted.  Foley catheter was placed.  Today creatinine has improved to 3.68.  Continue with normal saline 0.45% at 75 mL/h. 2. Bilateral hydronephrosis/prostatomegaly-CT abdomen pelvis shows bilateral hydronephrosis with large prostate.  Patient has Foley catheter and draining well.  Urology has seen the patient.  Continue Flomax.  Patient will be discharged with Foley catheter, and follow-up urology as outpatient. 3. Hyperkalemia-resolved, patient received Lokelma  4. Hypertension-continue amlodipine, hydralazine as needed.  ACE inhibitor is on hold due to renal failure as above.    SpO2: 98 %   COVID-19 Labs  Recent Labs    12/12/19 1603  FERRITIN 509*    Lab Results  Component Value Date   SARSCOV2NAA NEGATIVE 12/12/2019   SARSCOV2NAA NEGATIVE 11/16/2019      CBC: Recent Labs  Lab 12/12/19 1116 12/13/19 0330  WBC 7.0 9.7  HGB  9.6* 11.3*  HCT 29.7* 33.5*  MCV 96.4 95.2  PLT 208 250    Basic Metabolic Panel: Recent Labs  Lab 12/12/19 1603 12/12/19 1603 12/12/19 2000 12/13/19 0330 12/13/19 1114 12/14/19 0316 12/15/19 0421  NA 144  --   --  140 141 139 138  K 6.1*   < > 5.8* 6.6* 4.9 5.1 4.6  CL 109  --   --  107 104 103 105  CO2 24  --   --  22 27 25 25   GLUCOSE 119*  --   --  125* 133* 90 100*  BUN 66*  --   --  55* 52* 53* 44*  CREATININE 9.00*  --   --  6.94* 6.37* 5.13* 3.68*  CALCIUM 9.2  --   --  9.5 9.3 8.8* 9.1  PHOS 5.2*  --   --  5.4* 6.2* 6.1* 4.6   < > = values in this interval not displayed.     Liver Function Tests: Recent Labs  Lab 12/12/19 1116 12/12/19 1116 12/12/19 1603 12/13/19 0330 12/13/19 1114 12/14/19 0316 12/15/19 0421  AST 16  --   --   --   --   --   --   ALT 19  --   --   --   --   --   --   ALKPHOS 67  --   --   --   --   --   --   BILITOT 0.9  --   --   --   --   --   --   PROT 8.2*  --   --   --   --   --   --  ALBUMIN 4.5   < > 4.5 4.6 4.1 3.9 3.8   < > = values in this interval not displayed.        DVT prophylaxis: Heparin  Code Status: Full code  Family Communication: No family at bedside  Disposition Plan: Patient with acute kidney injury due to enlarged prostate, currently improving with Foley catheter placement.  Will need few more days of hospitalization for monitoring renal function closely.  Will likely go home with Foley catheter once renal function improves and okay with nephrology.       Scheduled medications:  . amLODipine  5 mg Oral Daily  . Chlorhexidine Gluconate Cloth  6 each Topical Daily  . heparin  5,000 Units Subcutaneous Q8H  . sodium zirconium cyclosilicate  10 g Oral TID  . tamsulosin  0.4 mg Oral Daily    Consultants:  Nephrology  Procedures:    Antibiotics:   Anti-infectives (From admission, onward)   None       Objective   Vitals:   12/14/19 1800 12/14/19 2238 12/15/19 0612 12/15/19 0616   BP:  (!) 146/92  (!) 146/94  Pulse:  94  81  Resp: 19 20  18   Temp:  98.8 F (37.1 C)  98.5 F (36.9 C)  TempSrc:  Oral  Oral  SpO2:  98%  98%  Weight:   84.2 kg   Height:        Intake/Output Summary (Last 24 hours) at 12/15/2019 1140 Last data filed at 12/15/2019 12/17/2019 Gross per 24 hour  Intake 840 ml  Output 6250 ml  Net -5410 ml    02/19 1901 - 02/21 0700 In: 1080 [P.O.:1080] Out: 8000 [Urine:8000]  Filed Weights   12/13/19 0611 12/14/19 0500 12/15/19 0612  Weight: 80.2 kg 80.2 kg 84.2 kg    Physical Examination:   General-appears in no acute distress Heart-S1-S2, regular, no murmur auscultated Lungs-clear to auscultation bilaterally, no wheezing or crackles auscultated Abdomen-soft, nontender, no organomegaly Extremities-no edema in the lower extremities Neuro-alert, oriented x3, no focal deficit noted  Data Reviewed:   Recent Results (from the past 240 hour(s))  SARS CORONAVIRUS 2 (TAT 6-24 HRS) Nasopharyngeal Nasopharyngeal Swab     Status: None   Collection Time: 12/12/19  1:38 PM   Specimen: Nasopharyngeal Swab  Result Value Ref Range Status   SARS Coronavirus 2 NEGATIVE NEGATIVE Final    Comment: (NOTE) SARS-CoV-2 target nucleic acids are NOT DETECTED. The SARS-CoV-2 RNA is generally detectable in upper and lower respiratory specimens during the acute phase of infection. Negative results do not preclude SARS-CoV-2 infection, do not rule out co-infections with other pathogens, and should not be used as the sole basis for treatment or other patient management decisions. Negative results must be combined with clinical observations, patient history, and epidemiological information. The expected result is Negative. Fact Sheet for Patients: 12/14/19 Fact Sheet for Healthcare Providers: HairSlick.no This test is not yet approved or cleared by the quierodirigir.com FDA and  has been authorized for  detection and/or diagnosis of SARS-CoV-2 by FDA under an Emergency Use Authorization (EUA). This EUA will remain  in effect (meaning this test can be used) for the duration of the COVID-19 declaration under Section 56 4(b)(1) of the Act, 21 U.S.C. section 360bbb-3(b)(1), unless the authorization is terminated or revoked sooner. Performed at [facility], [address], [place], [place]     Recent Labs  Lab 12/12/19 1116  LIPASE 32      Admission  status: Inpatient: Based on patients clinical presentation and evaluation of above clinical data, I have made determination that patient meets Inpatient criteria at this time.   Oswald Hillock   [facility] If 7PM-7AM, please contact night-coverage at [email or web address], Office  [phone]   12/15/2019, 11:40 AM  LOS: 3 days

## 2019-12-16 LAB — RENAL FUNCTION PANEL
Albumin: 3.9 g/dL (ref 3.5–5.0)
Anion gap: 11 (ref 5–15)
BUN: 50 mg/dL — ABNORMAL HIGH (ref 6–20)
CO2: 24 mmol/L (ref 22–32)
Calcium: 9.1 mg/dL (ref 8.9–10.3)
Chloride: 103 mmol/L (ref 98–111)
Creatinine, Ser: 3.48 mg/dL — ABNORMAL HIGH (ref 0.61–1.24)
GFR calc Af Amer: 22 mL/min — ABNORMAL LOW (ref >60)
GFR calc non Af Amer: 19 mL/min — ABNORMAL LOW (ref >60)
Glucose, Bld: 99 mg/dL (ref 70–99)
Phosphorus: 5.2 mg/dL — ABNORMAL HIGH (ref 2.5–4.6)
Potassium: 4.7 mmol/L (ref 3.5–5.1)
Sodium: 138 mmol/L (ref 135–145)

## 2019-12-16 NOTE — TOC Progression Note (Signed)
Transition of Care [facility]) - Progression Note    Patient Details  Name: John Woods MRN: [id number] Date of Birth: Apr 26, 1966  Transition of Care [facility]) CM/SW Contact  Geni Bers, RN Phone Number: 12/16/2019, 4:17 PM  Clinical Narrative:     TOC will follow for discharge needs.   Expected Discharge Plan: Home/Self Care Barriers to Discharge: Inadequate or no insurance  Expected Discharge Plan and Services Expected Discharge Plan: Home/Self Care   Discharge Planning Services: CM Consult   Living arrangements for the past 2 months: Single Family Home                                       Social Determinants of Health (SDOH) Interventions    Readmission Risk Interventions No flowsheet data found.

## 2019-12-16 NOTE — Progress Notes (Signed)
[facility]  PROGRESS NOTE  John Woods GQQ:[id number] DOB: 1965/12/19 DOA: 12/12/2019 PCP: Patient, No Pcp Per   Brief HPI:   54 year old male with a history of hypertension, tobacco abuse, enlarged prostate who came to ED with complaints of abdominal pain.  He was found to have acute kidney injury with creatinine of 10.32, patient's baseline creatinine 0.80 in March 2018.  Work-up was notable for CT abdomen pelvis showed moderate bilateral hydronephrosis, marked enlarged prostate and circumferentially thickened bladder wall suggestive of chronic bladder outlet obstruction.  Nephrology was consulted, Foley catheter was inserted.    Subjective   Patient seen and examined, renal function slowly improving.  IV fluids were discontinued by nephrology yesterday.  Currently maintaining good p.o. intake.   Assessment/Plan:     1. Acute kidney injury-patient presented with creatinine of 10.32, baseline creatinine 0.80 as of March 2018.  CT abdomen pelvis showed mildly enlarged prostate, moderate bilateral hydronephrosis.  Nephrology was consulted.  Foley catheter was placed.  Today creatinine has improved to 3.68.  IV fluids stopped per nephrology.  Monitor intake and output closely. 2. Bilateral hydronephrosis/prostatomegaly-CT abdomen pelvis shows bilateral hydronephrosis with large prostate.  Patient has Foley catheter and draining well.  Urology has seen the patient.  Continue Flomax.  Patient will be discharged with Foley catheter, and follow-up urology as outpatient. 3. Hyperkalemia-resolved, patient receiving Lokelma 10 g p.o. daily. 4. Hypertension-continue amlodipine, hydralazine as needed.  ACE inhibitor is on hold due to renal failure as above.    SpO2: 98 %   COVID-19 Labs  No results for input(s): DDIMER, FERRITIN, LDH, CRP in the last 72 hours.  Lab Results  Component Value Date   SARSCOV2NAA NEGATIVE 12/12/2019   [place] NEGATIVE 11/16/2019      CBC: Recent Labs   Lab 12/12/19 1116 12/13/19 0330  WBC 7.0 9.7  HGB 9.6* 11.3*  HCT 29.7* 33.5*  MCV 96.4 95.2  PLT 208 671    Basic Metabolic Panel: Recent Labs  Lab 12/13/19 0330 12/13/19 1114 12/14/19 0316 12/15/19 0421 12/16/19 0412  NA 140 141 139 138 138  K 6.6* 4.9 5.1 4.6 4.7  CL 107 104 103 105 103  CO2 22 27 25 25 24   GLUCOSE 125* 133* 90 100* 99  BUN 55* 52* 53* 44* 50*  CREATININE 6.94* 6.37* 5.13* 3.68* 3.48*  CALCIUM 9.5 9.3 8.8* 9.1 9.1  PHOS 5.4* 6.2* 6.1* 4.6 5.2*     Liver Function Tests: Recent Labs  Lab 12/12/19 1116 12/12/19 1603 12/13/19 0330 12/13/19 1114 12/14/19 0316 12/15/19 0421 12/16/19 0412  AST 16  --   --   --   --   --   --   ALT 19  --   --   --   --   --   --   ALKPHOS 67  --   --   --   --   --   --   BILITOT 0.9  --   --   --   --   --   --   PROT 8.2*  --   --   --   --   --   --   ALBUMIN 4.5   < > 4.6 4.1 3.9 3.8 3.9   < > = values in this interval not displayed.    DVT prophylaxis: Heparin  Code Status: Full code  Family Communication: No family at bedside  Disposition Plan: Patient with acute kidney injury due to enlarged prostate, currently  improving with Foley catheter placement.  Will need few more days of hospitalization for monitoring renal function closely.  Will likely go home with Foley catheter once renal function improves and okay with nephrology.       Scheduled medications:  . amLODipine  5 mg Oral Daily  . Chlorhexidine Gluconate Cloth  6 each Topical Daily  . heparin  5,000 Units Subcutaneous Q8H  . sodium zirconium cyclosilicate  10 g Oral Daily  . tamsulosin  0.4 mg Oral Daily    Consultants:  Nephrology  Procedures:    Antibiotics:   Anti-infectives (From admission, onward)   None       Objective   Vitals:   12/15/19 1302 12/15/19 2051 12/16/19 0613 12/16/19 0946  BP: 119/74 (!) 143/88 135/86 140/85  Pulse: 87 (!) 105 76 71  Resp: 17 17 17 18   Temp: 98.1 F (36.7 C) 99.1 F (37.3  C) 98.1 F (36.7 C) 98.2 F (36.8 C)  TempSrc: Oral Oral Oral Oral  SpO2: 100% 98% 98% 98%  Weight:      Height:        Intake/Output Summary (Last 24 hours) at 12/16/2019 1034 Last data filed at 12/16/2019 12/18/2019 Gross per 24 hour  Intake 360 ml  Output 2950 ml  Net -2590 ml    02/20 1901 - 02/22 0700 In: 1200 [P.O.:1200] Out: 6600 [Urine:6600]  Filed Weights   12/13/19 0611 12/14/19 0500 12/15/19 0612  Weight: 80.2 kg 80.2 kg 84.2 kg    Physical Examination:   General-appears in no acute distress Heart-S1-S2, regular, no murmur auscultated Lungs-clear to auscultation bilaterally, no wheezing or crackles auscultated Abdomen-soft, nontender, no organomegaly Extremities-no edema in the lower extremities Neuro-alert, oriented x3, no focal deficit noted  Data Reviewed:   Recent Results (from the past 240 hour(s))  SARS CORONAVIRUS 2 (TAT 6-24 HRS) Nasopharyngeal Nasopharyngeal Swab     Status: None   Collection Time: 12/12/19  1:38 PM   Specimen: Nasopharyngeal Swab  Result Value Ref Range Status   SARS Coronavirus 2 NEGATIVE NEGATIVE Final    Comment: (NOTE) SARS-CoV-2 target nucleic acids are NOT DETECTED. The SARS-CoV-2 RNA is generally detectable in upper and lower respiratory specimens during the acute phase of infection. Negative results do not preclude SARS-CoV-2 infection, do not rule out co-infections with other pathogens, and should not be used as the sole basis for treatment or other patient management decisions. Negative results must be combined with clinical observations, patient history, and epidemiological information. The expected result is Negative. Fact Sheet for Patients: 12/14/19 Fact Sheet for Healthcare Providers: HairSlick.no This test is not yet approved or cleared by the [employer] FDA and  has been authorized for detection and/or diagnosis of SARS-CoV-2 by FDA under an  Emergency Use Authorization (EUA). This EUA will remain  in effect (meaning this test can be used) for the duration of the COVID-19 declaration under Section 56 4(b)(1) of the Act, 21 U.S.C. section 360bbb-3(b)(1), unless the authorization is terminated or revoked sooner. Performed at [facility], [address] [place], [place]     Recent Labs  Lab 12/12/19 1116  LIPASE 32      Admission status: Inpatient: Based on patients clinical presentation and evaluation of above clinical data, I have made determination that patient meets Inpatient criteria at this time.   12/14/19   [facility] If 7PM-7AM, please contact night-coverage at [email or web address], Office  [phone]   12/16/2019, 10:34 AM  LOS: 4  days

## 2019-12-17 LAB — RENAL FUNCTION PANEL
Albumin: 3.9 g/dL (ref 3.5–5.0)
Anion gap: 10 (ref 5–15)
BUN: 49 mg/dL — ABNORMAL HIGH (ref 6–20)
CO2: 25 mmol/L (ref 22–32)
Calcium: 9.4 mg/dL (ref 8.9–10.3)
Chloride: 105 mmol/L (ref 98–111)
Creatinine, Ser: 3.28 mg/dL — ABNORMAL HIGH (ref 0.61–1.24)
GFR calc Af Amer: 24 mL/min — ABNORMAL LOW (ref >60)
GFR calc non Af Amer: 20 mL/min — ABNORMAL LOW (ref >60)
Glucose, Bld: 103 mg/dL — ABNORMAL HIGH (ref 70–99)
Phosphorus: 4.7 mg/dL — ABNORMAL HIGH (ref 2.5–4.6)
Potassium: 4.6 mmol/L (ref 3.5–5.1)
Sodium: 140 mmol/L (ref 135–145)

## 2019-12-17 MED ORDER — SODIUM CHLORIDE 0.45 % IV BOLUS
1000.0000 mL | Freq: Once | INTRAVENOUS | Status: AC
  Administered 2019-12-17: 12:00:00 1000 mL via INTRAVENOUS

## 2019-12-17 MED ORDER — SODIUM CHLORIDE 0.45 % IV SOLN: INTRAVENOUS | Status: DC

## 2019-12-17 NOTE — Progress Notes (Signed)
[facility]  PROGRESS NOTE  John Woods WNI:[id number] DOB: 04-08-1966 DOA: 12/12/2019 PCP: Patient, No Pcp Per   Brief HPI:   54 year old male with a history of hypertension, tobacco abuse, enlarged prostate who came to ED with complaints of abdominal pain.  He was found to have acute kidney injury with creatinine of 10.32, patient's baseline creatinine 0.80 in March 2018.  Work-up was notable for CT abdomen pelvis showed moderate bilateral hydronephrosis, marked enlarged prostate and circumferentially thickened bladder wall suggestive of chronic bladder outlet obstruction.  Nephrology was consulted, Foley catheter was inserted.    Subjective   Patient seen and examined, denies any complaints.  Feels dehydrated.  Has not been able to drink a lot of fluids as he is on 1200 cc fluid restriction.  Creatinine slowly improving.  Today creatinine is 3.48.   Assessment/Plan:     1. Acute kidney injury-patient presented with creatinine of 10.32, baseline creatinine 0.80 as of March 2018.  CT abdomen pelvis showed mildly enlarged prostate, moderate bilateral hydronephrosis.  Nephrology was consulted.  Foley catheter was placed.  Today creatinine has improved to 3.48.  IV fluids stopped per nephrology.  Called and discussed with nephrologist on-call Dr. Arlean Hopping, he recommends giving 1 L bolus of 0.45% normal saline and then starting 0.45% normal saline at 100 mL/h.  We will follow BMP in a.m. 2. Bilateral hydronephrosis/prostatomegaly-CT abdomen pelvis shows bilateral hydronephrosis with large prostate.  Patient has Foley catheter and draining well.  Urology has seen the patient.  Continue Flomax.  Patient will be discharged with Foley catheter, and follow-up urology as outpatient. 3. Hyperkalemia-resolved, patient receiving Lokelma 10 g p.o. daily. 4. Hypertension-continue amlodipine, hydralazine as needed.  ACE inhibitor is on hold due to renal failure as above.    SpO2: 99 %   COVID-19  Labs  No results for input(s): DDIMER, FERRITIN, LDH, CRP in the last 72 hours.  Lab Results  Component Value Date   SARSCOV2NAA NEGATIVE 12/12/2019   SARSCOV2NAA NEGATIVE 11/16/2019      CBC: Recent Labs  Lab 12/12/19 1116 12/13/19 0330  WBC 7.0 9.7  HGB 9.6* 11.3*  HCT 29.7* 33.5*  MCV 96.4 95.2  PLT 208 250    Basic Metabolic Panel: Recent Labs  Lab 12/13/19 1114 12/14/19 0316 12/15/19 0421 12/16/19 0412 12/17/19 0312  NA 141 139 138 138 140  K 4.9 5.1 4.6 4.7 4.6  CL 104 103 105 103 105  CO2 27 25 25 24 25   GLUCOSE 133* 90 100* 99 103*  BUN 52* 53* 44* 50* 49*  CREATININE 6.37* 5.13* 3.68* 3.48* 3.28*  CALCIUM 9.3 8.8* 9.1 9.1 9.4  PHOS 6.2* 6.1* 4.6 5.2* 4.7*     Liver Function Tests: Recent Labs  Lab 12/12/19 1116 12/12/19 1603 12/13/19 1114 12/14/19 0316 12/15/19 0421 12/16/19 0412 12/17/19 0312  AST 16  --   --   --   --   --   --   ALT 19  --   --   --   --   --   --   ALKPHOS 67  --   --   --   --   --   --   BILITOT 0.9  --   --   --   --   --   --   PROT 8.2*  --   --   --   --   --   --   ALBUMIN 4.5   < > 4.1 3.9  3.8 3.9 3.9   < > = values in this interval not displayed.    DVT prophylaxis: Heparin  Code Status: Full code  Family Communication: No family at bedside  Disposition Plan: Patient with acute kidney injury due to enlarged prostate, currently improving with Foley catheter placement.  Will need few more days of hospitalization for monitoring renal function closely.  Will likely go home with Foley catheter once renal function improves.        Scheduled medications:  . amLODipine  5 mg Oral Daily  . Chlorhexidine Gluconate Cloth  6 each Topical Daily  . heparin  5,000 Units Subcutaneous Q8H  . sodium zirconium cyclosilicate  10 g Oral Daily  . tamsulosin  0.4 mg Oral Daily    Consultants:  Nephrology  Procedures:    Antibiotics:   Anti-infectives (From admission, onward)   None       Objective    Vitals:   12/16/19 2026 12/17/19 0456 12/17/19 0500 12/17/19 1242  BP: 133/89 (!) 136/93  (!) 144/97  Pulse: 86 90  85  Resp: 18 18  18   Temp: 98.5 F (36.9 C) 98.3 F (36.8 C)  98.2 F (36.8 C)  TempSrc: Oral Oral  Oral  SpO2: 98% 98%  99%  Weight:   80.8 kg   Height:        Intake/Output Summary (Last 24 hours) at 12/17/2019 1304 Last data filed at 12/17/2019 0900 Gross per 24 hour  Intake 660 ml  Output 2950 ml  Net -2290 ml    02/21 1901 - 02/23 0700 In: 780 [P.O.:780] Out: 4200 [Urine:4200]  Filed Weights   12/14/19 0500 12/15/19 0612 12/17/19 0500  Weight: 80.2 kg 84.2 kg 80.8 kg    Physical Examination:   General-appears in no acute distress Heart-S1-S2, regular, no murmur auscultated Lungs-clear to auscultation bilaterally, no wheezing or crackles auscultated Abdomen-soft, nontender, no organomegaly Extremities-no edema in the lower extremities Neuro-alert, oriented x3, no focal deficit noted  Data Reviewed:   Recent Results (from the past 240 hour(s))  SARS CORONAVIRUS 2 (TAT 6-24 HRS) Nasopharyngeal Nasopharyngeal Swab     Status: None   Collection Time: 12/12/19  1:38 PM   Specimen: Nasopharyngeal Swab  Result Value Ref Range Status   SARS Coronavirus 2 NEGATIVE NEGATIVE Final    Comment: (NOTE) SARS-CoV-2 target nucleic acids are NOT DETECTED. The SARS-CoV-2 RNA is generally detectable in upper and lower respiratory specimens during the acute phase of infection. Negative results do not preclude SARS-CoV-2 infection, do not rule out co-infections with other pathogens, and should not be used as the sole basis for treatment or other patient management decisions. Negative results must be combined with clinical observations, patient history, and epidemiological information. The expected result is Negative. Fact Sheet for Patients: 12/14/19 Fact Sheet for Healthcare  Providers: [employer]Slick[employer]no This test is not yet approved or cleared by the [employer]ir[employer] FDA and  has been authorized for detection and/or diagnosis of SARS-CoV-2 by FDA under an Emergency Use Authorization (EUA). This EUA will remain  in effect (meaning this test can be used) for the duration of the COVID-19 declaration under Section 56 4(b)(1) of the Act, 21 U.S.C. section 360bbb-3(b)(1), unless the authorization is terminated or revoked sooner. Performed at [facility], [address], [place], [place]     Recent Labs  Lab 12/12/19 1116  LIPASE 32      Admission status: Inpatient: Based on patients clinical presentation and evaluation of above clinical  data, I have made determination that patient meets Inpatient criteria at this time.   Oswald Hillock   [facility] If 7PM-7AM, please contact night-coverage at [email or web address], Office  [phone]   12/17/2019, 1:04 PM  LOS: 5 days

## 2019-12-17 NOTE — Plan of Care (Signed)
  Problem: Education: Goal: Knowledge of General Education information will improve Description Including pain rating scale, medication(s)/side effects and non-pharmacologic comfort measures Outcome: Progressing   Problem: Health Behavior/Discharge Planning: Goal: Ability to manage health-related needs will improve Outcome: Progressing   

## 2019-12-18 LAB — RENAL FUNCTION PANEL
Albumin: 3.7 g/dL (ref 3.5–5.0)
Anion gap: 9 (ref 5–15)
BUN: 47 mg/dL — ABNORMAL HIGH (ref 6–20)
CO2: 25 mmol/L (ref 22–32)
Calcium: 9 mg/dL (ref 8.9–10.3)
Chloride: 104 mmol/L (ref 98–111)
Creatinine, Ser: 2.96 mg/dL — ABNORMAL HIGH (ref 0.61–1.24)
GFR calc Af Amer: 27 mL/min — ABNORMAL LOW (ref >60)
GFR calc non Af Amer: 23 mL/min — ABNORMAL LOW (ref >60)
Glucose, Bld: 103 mg/dL — ABNORMAL HIGH (ref 70–99)
Phosphorus: 4.3 mg/dL (ref 2.5–4.6)
Potassium: 4.3 mmol/L (ref 3.5–5.1)
Sodium: 138 mmol/L (ref 135–145)

## 2019-12-18 MED ORDER — TAMSULOSIN HCL 0.4 MG PO CAPS: 0.4000 mg | ORAL_CAPSULE | Freq: Every day | ORAL | 3 refills | Status: DC

## 2019-12-18 MED ORDER — BISACODYL 10 MG RE SUPP: 10.0000 mg | Freq: Once | RECTAL | Status: DC

## 2019-12-18 MED ORDER — OXYBUTYNIN CHLORIDE 5 MG PO TABS: 5.0000 mg | ORAL_TABLET | Freq: Three times a day (TID) | ORAL | 0 refills | Status: DC | PRN

## 2019-12-18 MED ORDER — AMLODIPINE BESYLATE 5 MG PO TABS: 5.0000 mg | ORAL_TABLET | Freq: Every day | ORAL | 0 refills | Status: DC

## 2019-12-18 MED ORDER — POLYETHYLENE GLYCOL 3350 17 G PO PACK: 17.0000 g | PACK | Freq: Every day | ORAL | 0 refills | Status: DC

## 2019-12-18 MED ORDER — POLYETHYLENE GLYCOL 3350 17 G PO PACK
17.0000 g | PACK | Freq: Once | ORAL | Status: AC
  Administered 2019-12-18: 11:00:00 17 g via ORAL
  Filled 2019-12-18: qty 1

## 2019-12-18 NOTE — Plan of Care (Signed)
  Problem: Education: Goal: Knowledge of General Education information will improve Description: Including pain rating scale, medication(s)/side effects and non-pharmacologic comfort measures Outcome: Adequate for Discharge   Problem: Nutrition: Goal: Adequate nutrition will be maintained Outcome: Adequate for Discharge   Problem: Health Behavior/Discharge Planning: Goal: Ability to manage health-related needs will improve Outcome: Adequate for Discharge

## 2019-12-18 NOTE — Progress Notes (Signed)
Pt was discharged. AVS given to pt with updated list of medicines and follow up appointments.

## 2019-12-18 NOTE — TOC Progression Note (Signed)
Transition of Care [facility]) - Progression Note    Patient Details  Name: JOSEPHINE RUDNICK MRN: [id number] Date of Birth: 1966/01/09  [facility] CM/SW Contact  Geni Bers, RN Phone Number: 12/18/2019, 11:51 AM  Clinical Narrative:    Appointment at Patient Care on March 9th at 9:20 AM. Pt was made aware and states he will keep this appointment.    Expected Discharge Plan: Home/Self Care Barriers to Discharge: Inadequate or no insurance  Expected Discharge Plan and Services Expected Discharge Plan: Home/Self Care   Discharge Planning Services: CM Consult   Living arrangements for the past 2 months: Single Family Home Expected Discharge Date: 12/18/19                                     Social Determinants of Health (SDOH) Interventions    Readmission Risk Interventions No flowsheet data found.

## 2019-12-18 NOTE — Progress Notes (Signed)
Foley care reviewed with patient and pt able to demonstrate he understand the home care need of foley and leg bag exchange. Pt instructed on the preferred time to switch to drainage bag vs leg bag. Pt demonstrated proper emptying of leg bag. Will cont to monitor and answer questions until pt is discharged later today. SRP, RN

## 2019-12-18 NOTE — Discharge Instructions (Signed)
Acute Urinary Retention, Male  Acute urinary retention is a condition in which a person is unable to pass urine. This can last for a short time or for a long time. If left untreated, it can result in kidney damage or other serious complications. What are the causes? This condition may be caused by:  Obstruction or narrowing of the tube that drains the bladder (urethra). This may be caused by surgery or problems with nearby organs, such as the prostate gland, which can press or squeeze the urethra.  Problems with the nerves in the bladder. These can be caused by diseases, such as multiple sclerosis, or by spinal cord injuries.  Certain medicines.  Tumors in the area of the pelvis, bladder, or urethra.  Diabetes.  Degenerative cognitive conditions such as delirium or dementia.  Bladder or urinary tract infection.  Constipation.  Blood in the urine (hematuria).  Injury to the bladder or urethra.  Psychological (psychogenic) conditions. Someone may hold his urine due to trauma or because he does not want to use the bathroom. What increases the risk? This condition is more likely to develop in older men. As men age, their prostate may become larger and may start pressing or squeezing on the bladder or the urethra. What are the signs or symptoms? Symptoms of this condition include:  Trouble urinating.  Pain in the lower abdomen. Symptoms usually come on slowly over a long period of time. How is this diagnosed? This condition is diagnosed based on a physical exam and a medical history. You may also have other tests, including:  An ultrasound of the bladder or kidneys or both.  Blood tests.  A urine analysis.  Additional tests may be needed such as an MRI, kidney, or bladder function tests. How is this treated? Treatment for this condition may include:  Medicines.  Placing a thin, sterile tube (catheter) into the bladder to drain urine out of the body. This is called an  indwelling urinary catheter. After being inserted, the catheter is held in place with a small balloon that is filled with sterile water. Urine drains from the catheter into a collection bag outside of the body.  Behavioral therapy.  Treatment for any underlying conditions.  If needed, you may be treated in the hospital for kidney function problems or to manage other complications. Follow these instructions at home:  Take over-the-counter and prescription medicines only as told by your health care provider. Avoid certain medicines, such as decongestants, antihistamines, and some prescription medicines. Do not take any medicine unless your health care provider has approved.  If you were given an indwelling urinary catheter, take care of it as told by your health care provider.  Drink enough fluid to keep your urine clear or pale yellow.  If you were prescribed an antibiotic, take it as told by your health care provider. Do not stop taking the antibiotic even if you start to feel better.  Do not use any products that contain nicotine or tobacco, such as cigarettes and e-cigarettes. If you need help quitting, ask your health care provider.  Monitor any changes in your symptoms. Tell your health care provider about any changes.  If instructed, monitor your blood pressure at home. Report changes as told by your health care provider.  Keep all follow-up visits as told by your health care provider. This is important. Contact a health care provider if:  You have uncomfortable bladder contractions that you cannot control (spasms) or you leak urine with the spasms.   Get help right away if:  You have chills or fever.  You have blood in your urine.  You have a catheter and: ? Your catheter stops draining urine. ? Your catheter falls out. Summary  Acute urinary retention is a condition in which a person is unable to pass urine. If left untreated, it can result in kidney damage or other serious  complications.  The cause of this condition may include an enlarged prostate. As men age, their prostate gland may become larger and may start pressing or squeezing on the bladder or the urethra.  Treatment for this condition may include medicines and placement of an indwelling urinary catheter.  Monitor any changes in your symptoms. Tell your health care provider about any changes. This information is not intended to replace advice given to you by your health care provider. Make sure you discuss any questions you have with your health care provider. Document Revised: 09/22/2017 Document Reviewed: 11/11/2016 Elsevier Patient Education  2020 [employer]   Acute Kidney Injury, Adult  Acute kidney injury is a sudden worsening of kidney function. The kidneys are organs that have several jobs. They filter the blood to remove waste products and extra fluid. They also maintain a healthy balance of minerals and hormones in the body, which helps control blood pressure and keep bones strong. With this condition, your kidneys do not do their jobs as well as they should. This condition ranges from mild to severe. Over time it may develop into long-lasting (chronic) kidney disease. Early detection and treatment may prevent acute kidney injury from developing into a chronic condition. What are the causes? Common causes of this condition include:  A problem with blood flow to the kidneys. This may be caused by: ? Low blood pressure (hypotension) or shock. ? Blood loss. ? Heart and blood vessel (cardiovascular) disease. ? Severe burns. ? Liver disease.  Direct damage to the kidneys. This may be caused by: ? Certain medicines. ? A kidney infection. ? Poisoning. ? Being around or in contact with toxic substances. ? A surgical wound. ? A hard, direct hit to the kidney area.  A sudden blockage of urine flow. This may be caused by: ? Cancer. ? Kidney stones. ? An enlarged prostate in males. What  are the signs or symptoms? Symptoms of this condition may not be obvious until the condition becomes severe. Symptoms of this condition can include:  Tiredness (lethargy), or difficulty staying awake.  Nausea or vomiting.  Swelling (edema) of the face, legs, ankles, or feet.  Problems with urination, such as: ? Abdominal pain, or pain along the side of your stomach (flank). ? Decreased urine production. ? Decrease in the force of urine flow.  Muscle twitches and cramps, especially in the legs.  Confusion or trouble concentrating.  Loss of appetite.  Fever. How is this diagnosed? This condition may be diagnosed with tests, including:  Blood tests.  Urine tests.  Imaging tests.  A test in which a sample of tissue is removed from the kidneys to be examined under a microscope (kidney biopsy). How is this treated? Treatment for this condition depends on the cause and how severe the condition is. In mild cases, treatment may not be needed. The kidneys may heal on their own. In more severe cases, treatment will involve:  Treating the cause of the kidney injury. This may involve changing any medicines you are taking or adjusting your dosage.  Fluids. You may need specialized IV fluids to balance your body's  needs.  Having a catheter placed to drain urine and prevent blockages.  Preventing problems from occurring. This may mean avoiding certain medicines or procedures that can cause further injury to the kidneys. In some cases treatment may also require:  A procedure to remove toxic wastes from the body (dialysis or continuous renal replacement therapy - CRRT).  Surgery. This may be done to repair a torn kidney, or to remove the blockage from the urinary system. Follow these instructions at home: Medicines  Take over-the-counter and prescription medicines only as told by your health care provider.  Do not take any new medicines without your health care provider's approval.  Many medicines can worsen your kidney damage.  Do not take any vitamin and mineral supplements without your health care provider's approval. Many nutritional supplements can worsen your kidney damage. Lifestyle  If your health care provider prescribed changes to your diet, follow them. You may need to decrease the amount of protein you eat.  Achieve and maintain a healthy weight. If you need help with this, ask your health care provider.  Start or continue an exercise plan. Try to exercise at least 30 minutes a day, 5 days a week.  Do not use any tobacco products, such as cigarettes, chewing tobacco, and e-cigarettes. If you need help quitting, ask your health care provider. General instructions  Keep track of your blood pressure. Report changes in your blood pressure as told by your health care provider.  Stay up to date with immunizations. Ask your health care provider which immunizations you need.  Keep all follow-up visits as told by your health care provider. This is important. Where to find more information  American Association of Kidney Patients: ResidentialShow.is  [employer]: www.kidney.org  American Kid[employer]: FightingMatch.com.ee  Life Options Rehabilitation [facility]: ? www.lifeoptions.org ? www.kidneyschool.org Contact a health care provider if:  Your symptoms get worse.  You develop new symptoms. Get help right away if:  You develop symptoms of worsening kidney disease, which include: ? Headaches. ? Abnormally dark or light skin. ? Easy bruising. ? Frequent hiccups. ? Chest pain. ? Shortness of breath. ? End of menstruation in women. ? Seizures. ? Confusion or altered mental status. ? Abdominal or back pain. ? Itchiness.  You have a fever.  Your body is producing less urine.  You have pain or bleeding when you urinate. Summary  Acute kidney injury is a sudden worsening of kidney function.  Acute kidney injury can be caused by problems with  blood flow to the kidneys, direct damage to the kidneys, and sudden blockage of urine flow.  Symptoms of this condition may not be obvious until it becomes severe. Symptoms may include edema, lethargy, confusion, nausea or vomiting, and problems passing urine.  This condition can usually be diagnosed with blood tests, urine tests, and imaging tests. Sometimes a kidney biopsy is done to diagnose this condition.  Treatment for this condition often involves treating the underlying cause. It is treated with fluids, medicines, dialysis, diet changes, or surgery. This information is not intended to replace advice given to you by your health care provider. Make sure you discuss any questions you have with your health care provider. Document Revised: 09/22/2017 Document Reviewed: 09/30/2016 Elsevier Patient Education  2020 Else[employer] Inc.    Acute Kidney Injury, Adult  Acute kidney injury is a sudden worsening of kidney function. The kidneys are organs that have several jobs. They filter the blood to remove waste products and extra fluid. They also  maintain a healthy balance of minerals and hormones in the body, which helps control blood pressure and keep bones strong. With this condition, your kidneys do not do their jobs as well as they should. This condition ranges from mild to severe. Over time it may develop into long-lasting (chronic) kidney disease. Early detection and treatment may prevent acute kidney injury from developing into a chronic condition. What are the causes? Common causes of this condition include:  A problem with blood flow to the kidneys. This may be caused by: ? Low blood pressure (hypotension) or shock. ? Blood loss. ? Heart and blood vessel (cardiovascular) disease. ? Severe burns. ? Liver disease.  Direct damage to the kidneys. This may be caused by: ? Certain medicines. ? A kidney infection. ? Poisoning. ? Being around or in contact with toxic substances. ? A  surgical wound. ? A hard, direct hit to the kidney area.  A sudden blockage of urine flow. This may be caused by: ? Cancer. ? Kidney stones. ? An enlarged prostate in males. What are the signs or symptoms? Symptoms of this condition may not be obvious until the condition becomes severe. Symptoms of this condition can include:  Tiredness (lethargy), or difficulty staying awake.  Nausea or vomiting.  Swelling (edema) of the face, legs, ankles, or feet.  Problems with urination, such as: ? Abdominal pain, or pain along the side of your stomach (flank). ? Decreased urine production. ? Decrease in the force of urine flow.  Muscle twitches and cramps, especially in the legs.  Confusion or trouble concentrating.  Loss of appetite.  Fever. How is this diagnosed? This condition may be diagnosed with tests, including:  Blood tests.  Urine tests.  Imaging tests.  A test in which a sample of tissue is removed from the kidneys to be examined under a microscope (kidney biopsy). How is this treated? Treatment for this condition depends on the cause and how severe the condition is. In mild cases, treatment may not be needed. The kidneys may heal on their own. In more severe cases, treatment will involve:  Treating the cause of the kidney injury. This may involve changing any medicines you are taking or adjusting your dosage.  Fluids. You may need specialized IV fluids to balance your body's needs.  Having a catheter placed to drain urine and prevent blockages.  Preventing problems from occurring. This may mean avoiding certain medicines or procedures that can cause further injury to the kidneys. In some cases treatment may also require:  A procedure to remove toxic wastes from the body (dialysis or continuous renal replacement therapy - CRRT).  Surgery. This may be done to repair a torn kidney, or to remove the blockage from the urinary system. Follow these instructions at  home: Medicines  Take over-the-counter and prescription medicines only as told by your health care provider.  Do not take any new medicines without your health care provider's approval. Many medicines can worsen your kidney damage.  Do not take any vitamin and mineral supplements without your health care provider's approval. Many nutritional supplements can worsen your kidney damage. Lifestyle  If your health care provider prescribed changes to your diet, follow them. You may need to decrease the amount of protein you eat.  Achieve and maintain a healthy weight. If you need help with this, ask your health care provider.  Start or continue an exercise plan. Try to exercise at least 30 minutes a day, 5 days a week.  Do not  use any tobacco products, such as cigarettes, chewing tobacco, and e-cigarettes. If you need help quitting, ask your health care provider. General instructions  Keep track of your blood pressure. Report changes in your blood pressure as told by your health care provider.  Stay up to date with immunizations. Ask your health care provider which immunizations you need.  Keep all follow-up visits as told by your health care provider. This is important. Where to find more information  American Association of Kidney Patients: BombTimer.gl  National Kidney Foundation: www.kidney.[place]: https://mathis.com/  [facility]: ? www.lifeoptions.org ? www.kidneyschool.org Contact a health care provider if:  Your symptoms get worse.  You develop new symptoms. Get help right away if:  You develop symptoms of worsening kidney disease, which include: ? Headaches. ? Abnormally dark or light skin. ? Easy bruising. ? Frequent hiccups. ? Chest pain. ? Shortness of breath. ? End of menstruation in women. ? Seizures. ? Confusion or altered mental status. ? Abdominal or back pain. ? Itchiness.  You have a fever.  Your body is  producing less urine.  You have pain or bleeding when you urinate. Summary  Acute kidney injury is a sudden worsening of kidney function.  Acute kidney injury can be caused by problems with blood flow to the kidneys, direct damage to the kidneys, and sudden blockage of urine flow.  Symptoms of this condition may not be obvious until it becomes severe. Symptoms may include edema, lethargy, confusion, nausea or vomiting, and problems passing urine.  This condition can usually be diagnosed with blood tests, urine tests, and imaging tests. Sometimes a kidney biopsy is done to diagnose this condition.  Treatment for this condition often involves treating the underlying cause. It is treated with fluids, medicines, dialysis, diet changes, or surgery. This information is not intended to replace advice given to you by your health care provider. Make sure you discuss any questions you have with your health care provider. Document Revised: 09/22/2017 Document Reviewed: 09/30/2016 Elsevier Patient Education  2020 [employer].

## 2019-12-18 NOTE — Discharge Summary (Signed)
Physician Discharge Summary  John Woods GDJ:[id number] DOB: [birth year] DOA: 12/12/2019  PCP: Patient, No Pcp Per  Admit date: 12/12/2019 Discharge date: 12/18/2019  Admitted From: Home  Disposition: Home   Recommendations for Outpatient Follow-up:  1. Follow up with PCP in 1-2 weeks 2. Please obtain BMP/CBC in one week 3. Needs to follow up with Urology for voiding trial.  4. Needs Bmet to follow renal function.   Home Health: none  Discharge Condition: Stable.  CODE STATUS: DNR Diet recommendation: Heart Healthy  Brief/Interim Summary: 54 year old male with a history of hypertension, tobacco abuse, enlarged prostate who came to ED with complaints of abdominal pain.  He was found to have acute kidney injury with creatinine of 10.32, patient's baseline creatinine 0.80 in March 2018.  Work-up was notable for CT abdomen pelvis showed moderate bilateral hydronephrosis, marked enlarged prostate and circumferentially thickened bladder wall suggestive of chronic bladder outlet obstruction.  Nephrology was consulted, Foley catheter was inserted.  1-acute kidney injury: Postobstructive uropathy and hypovolemia. Patient presented with a creatinine of 10.  CT abdomen and pelvis showed mildly enlarged prostate, moderate bilateral hydronephrosis.  Nephrology was consulted.  Foley catheter was placed.  Patient renal function improved with IV fluids.  The day of discharge his creatinine has decreased to  2.9. Patient was advised to drink plenty of fluid, more than 3 L a day.  He will need close follow-up to check renal function.  Bilateral hydronephrosis, prostatomegaly;  Urology was consulted, they recommended Foley catheter placement.  Patient was a started on Flomax.  Patient to be discharged with Foley catheter in place and need to follow-up with urology in 1 week.  Hyperkalemia: Resolved received a dose of Lokelma. Pretension he was a started amlodipine.  ACE inhibitor was stopped.  Discharge  Diagnoses:  Active Problems:   HTN (hypertension)   Acute renal failure (ARF) (HCC)   Anemia, unspecified   Enlarged prostate    Discharge Instructions  Discharge Instructions    Diet - low sodium heart healthy   Complete by: As directed    Increase activity slowly   Complete by: As directed      Allergies as of 12/18/2019   No Known Allergies     Medication List    STOP taking these medications   azithromycin 250 MG tablet Commonly known as: Zithromax   ciprofloxacin 500 MG tablet Commonly known as: CIPRO   ibuprofen 600 MG tablet Commonly known as: ADVIL   lisinopril 10 MG tablet Commonly known as: ZESTRIL     TAKE these medications   acetaminophen 500 MG tablet Commonly known as: TYLENOL Take 500 mg by mouth every 6 (six) hours as needed for headache.   amLODipine 5 MG tablet Commonly known as: NORVASC Take 1 tablet (5 mg total) by mouth daily. Start taking on: December 19, 2019   oxybutynin 5 MG tablet Commonly known as: DITROPAN Take 1 tablet (5 mg total) by mouth every 8 (eight) hours as needed for bladder spasms.   polyethylene glycol 17 g packet Commonly known as: MIRALAX / GLYCOLAX Take 17 g by mouth daily.   tamsulosin 0.4 MG Caps capsule Commonly known as: FLOMAX Take 1 capsule (0.4 mg total) by mouth daily. Start taking on: December 19, 2019      Follow-up Information    Raynelle Bring, MD Follow up in 1 week(s).   Specialty: Urology Why: please call office and schedule appointment.  Contact information: [place] [address] [phone]  No Known Allergies  Consultations:  Urology  Nephrology   Procedures/Studies: CT ABDOMEN PELVIS WO CONTRAST  Result Date: 12/12/2019 CLINICAL DATA:  Abdominal pain for 1 year EXAM: CT ABDOMEN AND PELVIS WITHOUT CONTRAST TECHNIQUE: Multidetector CT imaging of the abdomen and pelvis was performed following the standard protocol without IV contrast. COMPARISON:   12/22/2016 FINDINGS: Lower chest: No acute abnormality. Hepatobiliary: No focal liver abnormality identified. Unchanged tiny calcification which is either within the gallbladder lumen or immediately adjacent to the gallbladder. Gallbladder is not dilated. No biliary dilatation. Pancreas: Unremarkable. No pancreatic ductal dilatation or surrounding inflammatory changes. Spleen: Normal in size without focal abnormality. Adrenals/Urinary Tract: Unremarkable adrenal glands. Unchanged midpole cyst at the left kidney measuring approximately 3.1 cm. No renal or ureteral calculi. Moderate bilateral hydronephrosis. Urinary bladder is markedly distended with circumferentially thickened walls. Stomach/Bowel: Stomach is within normal limits. Appendix appears normal. No evidence of bowel wall thickening, distention, or inflammatory changes. Vascular/Lymphatic: No significant vascular findings are present. No enlarged abdominal or pelvic lymph nodes are identified. Reproductive: Markedly enlarged prostate gland measuring approximately 5.9 x 5.4 x 7.4 cm resulting in impress upon the inferior aspect of the urinary bladder. Other: Tiny fat containing umbilical hernia. No abdominopelvic ascites. Musculoskeletal: No acute or significant osseous findings. Degenerative changes of the pubic symphysis, progressed from prior. No lytic or sclerotic osseous lesions are identified. IMPRESSION: 1. Markedly enlarged prostate gland resulting in impress upon the inferior aspect of the urinary bladder. There is associated moderate bilateral hydronephrosis. Correlation with serum PSA and urology consultation are recommended. 2. Urinary bladder is markedly distended with circumferentially thickened walls. Findings are suggestive of chronic bladder outlet obstruction. 3. Degenerative changes of the pubic symphysis, progressed from prior study. Electronically Signed   By: Duanne Guess D.O.   On: 12/12/2019 12:59    (Echo, Carotid, EGD,  Colonoscopy, ERCP)    Subjective:   Discharge Exam: Vitals:   12/17/19 2150 12/18/19 0430  BP: 133/87 125/87  Pulse: 81 83  Resp: 16 17  Temp: 98.4 F (36.9 C) 98.6 F (37 C)  SpO2: 100% 97%     General: Pt is alert, awake, not in acute distress Cardiovascular: RRR, S1/S2 +, no rubs, no gallops Respiratory: CTA bilaterally, no wheezing, no rhonchi Abdominal: Soft, NT, ND, bowel sounds + Extremities: no edema, no cyanosis    The results of significant diagnostics from this hospitalization (including imaging, microbiology, ancillary and laboratory) are listed below for reference.     Microbiology: Recent Results (from the past 240 hour(s))  SARS CORONAVIRUS 2 (TAT 6-24 HRS) Nasopharyngeal Nasopharyngeal Swab     Status: None   Collection Time: 12/12/19  1:38 PM   Specimen: Nasopharyngeal Swab  Result Value Ref Range Status   SARS Coronavirus 2 NEGATIVE NEGATIVE Final    Comment: (NOTE) SARS-CoV-2 target nucleic acids are NOT DETECTED. The SARS-CoV-2 RNA is generally detectable in upper and lower respiratory specimens during the acute phase of infection. Negative results do not preclude SARS-CoV-2 infection, do not rule out co-infections with other pathogens, and should not be used as the sole basis for treatment or other patient management decisions. Negative results must be combined with clinical observations, patient history, and epidemiological information. The expected result is Negative. Fact Sheet for Patients: HairSlick.no Fact Sheet for Healthcare Providers: quierodirigir.com This test is not yet approved or cleared by the [place] FDA and  has been authorized for detection and/or diagnosis of SARS-CoV-2 by FDA under an Emergency Use Authorization (EUA). This  EUA will remain  in effect (meaning this test can be used) for the duration of the COVID-19 declaration under Section 56 4(b)(1) of the Act,  21 U.S.C. section 360bbb-3(b)(1), unless the authorization is terminated or revoked sooner. Performed at [facility], [address] [place], [place] [address]      Labs: BNP (last 3 results) No results for input(s): BNP in the last 8760 hours. Basic Metabolic Panel: Recent Labs  Lab 12/14/19 0316 12/15/19 0421 12/16/19 0412 12/17/19 0312 12/18/19 0338  NA 139 138 138 140 138  K 5.1 4.6 4.7 4.6 4.3  CL 103 105 103 105 104  CO2 25 25 24 25 25   GLUCOSE 90 100* 99 103* 103*  BUN 53* 44* 50* 49* 47*  CREATININE 5.13* 3.68* 3.48* 3.28* 2.96*  CALCIUM 8.8* 9.1 9.1 9.4 9.0  PHOS 6.1* 4.6 5.2* 4.7* 4.3   Liver Function Tests: Recent Labs  Lab 12/12/19 1116 12/12/19 1603 12/14/19 0316 12/15/19 0421 12/16/19 0412 12/17/19 0312 12/18/19 0338  AST 16  --   --   --   --   --   --   ALT 19  --   --   --   --   --   --   ALKPHOS 67  --   --   --   --   --   --   BILITOT 0.9  --   --   --   --   --   --   PROT 8.2*  --   --   --   --   --   --   ALBUMIN 4.5   < > 3.9 3.8 3.9 3.9 3.7   < > = values in this interval not displayed.   Recent Labs  Lab 12/12/19 1116  LIPASE 32   No results for input(s): AMMONIA in the last 168 hours. CBC: Recent Labs  Lab 12/12/19 1116 12/13/19 0330  WBC 7.0 9.7  HGB 9.6* 11.3*  HCT 29.7* 33.5*  MCV 96.4 95.2  PLT 208 250   Cardiac Enzymes: No results for input(s): CKTOTAL, CKMB, CKMBINDEX, TROPONINI in the last 168 hours. BNP: Invalid input(s): POCBNP CBG: No results for input(s): GLUCAP in the last 168 hours. D-Dimer No results for input(s): DDIMER in the last 72 hours. Hgb A1c No results for input(s): HGBA1C in the last 72 hours. Lipid Profile No results for input(s): CHOL, HDL, LDLCALC, TRIG, CHOLHDL, LDLDIRECT in the last 72 hours. Thyroid function studies No results for input(s): TSH, T4TOTAL, T3FREE, THYROIDAB in the last 72 hours.  Invalid input(s): FREET3 Anemia work up No results for input(s): VITAMINB12,  FOLATE, FERRITIN, TIBC, IRON, RETICCTPCT in the last 72 hours. Urinalysis    Component Value Date/Time   COLORURINE STRAW (A) 12/12/2019 1110   APPEARANCEUR CLEAR 12/12/2019 1110   LABSPEC 1.006 12/12/2019 1110   PHURINE 6.0 12/12/2019 1110   GLUCOSEU NEGATIVE 12/12/2019 1110   HGBUR MODERATE (A) 12/12/2019 1110   BILIRUBINUR NEGATIVE 12/12/2019 1110   KETONESUR NEGATIVE 12/12/2019 1110   PROTEINUR NEGATIVE 12/12/2019 1110   NITRITE NEGATIVE 12/12/2019 1110   LEUKOCYTESUR TRACE (A) 12/12/2019 1110   Sepsis Labs Invalid input(s): PROCALCITONIN,  WBC,  LACTICIDVEN Microbiology Recent Results (from the past 240 hour(s))  SARS CORONAVIRUS 2 (TAT 6-24 HRS) Nasopharyngeal Nasopharyngeal Swab     Status: None   Collection Time: 12/12/19  1:38 PM   Specimen: Nasopharyngeal Swab  Result Value Ref Range Status   SARS Coronavirus  2 NEGATIVE NEGATIVE Final    Comment: (NOTE) SARS-CoV-2 target nucleic acids are NOT DETECTED. The SARS-CoV-2 RNA is generally detectable in upper and lower respiratory specimens during the acute phase of infection. Negative results do not preclude SARS-CoV-2 infection, do not rule out co-infections with other pathogens, and should not be used as the sole basis for treatment or other patient management decisions. Negative results must be combined with clinical observations, patient history, and epidemiological information. The expected result is Negative. Fact Sheet for Patients: HairSlick.no Fact Sheet for Healthcare Providers: quierodirigir.com This test is not yet approved or cleared by the [place] FDA and  has been authorized for detection and/or diagnosis of SARS-CoV-2 by FDA under an Emergency Use Authorization (EUA). This EUA will remain  in effect (meaning this test can be used) for the duration of the COVID-19 declaration under Section 56 4(b)(1) of the Act, 21 U.S.C. section 360bbb-3(b)(1),  unless the authorization is terminated or revoked sooner. Performed at [facility], [address], [place], [place] [address]      Time coordinating discharge: 40 minutes  SIGNED:   Alba Cory, MD  [facility]

## 2019-12-23 DIAGNOSIS — E559 Vitamin D deficiency, unspecified: Secondary | ICD-10-CM

## 2019-12-23 DIAGNOSIS — R972 Elevated prostate specific antigen [PSA]: Secondary | ICD-10-CM

## 2019-12-23 DIAGNOSIS — R7989 Other specified abnormal findings of blood chemistry: Secondary | ICD-10-CM

## 2019-12-23 DIAGNOSIS — N3001 Acute cystitis with hematuria: Secondary | ICD-10-CM

## 2019-12-23 HISTORY — DX: Acute cystitis with hematuria: N30.01

## 2019-12-23 HISTORY — DX: Other specified abnormal findings of blood chemistry: R79.89

## 2019-12-23 HISTORY — DX: Vitamin D deficiency, unspecified: E55.9

## 2019-12-23 HISTORY — DX: Elevated prostate specific antigen (PSA): R97.20

## 2019-12-26 ENCOUNTER — Emergency Department (HOSPITAL_COMMUNITY)
Admission: EM | Admit: 2019-12-26 | Discharge: 2019-12-26 | Disposition: A | Discharge status: Home / Self Care | Payer: Self-pay | Attending: Emergency Medicine | Admitting: Emergency Medicine

## 2019-12-26 ENCOUNTER — Other Ambulatory Visit: Payer: Self-pay

## 2019-12-26 ENCOUNTER — Encounter (HOSPITAL_COMMUNITY): Payer: Self-pay | Admitting: Emergency Medicine

## 2019-12-26 ENCOUNTER — Ambulatory Visit: Payer: Self-pay | Admitting: *Deleted

## 2019-12-26 DIAGNOSIS — I10 Essential (primary) hypertension: Secondary | ICD-10-CM | POA: Insufficient documentation

## 2019-12-26 DIAGNOSIS — F17210 Nicotine dependence, cigarettes, uncomplicated: Secondary | ICD-10-CM | POA: Insufficient documentation

## 2019-12-26 DIAGNOSIS — Z79899 Other long term (current) drug therapy: Secondary | ICD-10-CM | POA: Insufficient documentation

## 2019-12-26 DIAGNOSIS — R319 Hematuria, unspecified: Secondary | ICD-10-CM | POA: Insufficient documentation

## 2019-12-26 DIAGNOSIS — N3001 Acute cystitis with hematuria: Secondary | ICD-10-CM | POA: Insufficient documentation

## 2019-12-26 LAB — URINALYSIS, ROUTINE W REFLEX MICROSCOPIC
Bilirubin Urine: NEGATIVE
Glucose, UA: NEGATIVE mg/dL
Ketones, ur: NEGATIVE mg/dL
Nitrite: NEGATIVE
Protein, ur: 30 mg/dL — AB
RBC / HPF: >50 RBC/hpf — ABNORMAL HIGH (ref 0–5)
Specific Gravity, Urine: 1.006 (ref 1.005–1.030)
pH: 5 (ref 5.0–8.0)

## 2019-12-26 MED ORDER — CEPHALEXIN 500 MG PO CAPS: 500.0000 mg | ORAL_CAPSULE | Freq: Two times a day (BID) | ORAL | 0 refills | Status: AC

## 2019-12-26 NOTE — Discharge Instructions (Signed)
You were given a prescription for antibiotics. Please take the antibiotic prescription fully.   Please keep your appointment with urology for next week.  Return to the emergency department for any new or worsening symptoms.

## 2019-12-26 NOTE — ED Notes (Signed)
Patient foley irrigated. Urine return clear.

## 2019-12-26 NOTE — ED Triage Notes (Signed)
Patient reports he had a foley placed 2 weeks ago, said initially urine was bloody but it had since cleared. Around 1 hour ago patient noticed hematuria, denies any clots.

## 2019-12-26 NOTE — ED Provider Notes (Signed)
[place] DEPT Provider Note   CSN: [id number] Arrival date & time: 12/26/19  1640     History Chief Complaint  Patient presents with  . Hematuria    John Woods is a 54 y.o. male.  HPI   54 year old male with a history of enlarged prostate, hypertension, presenting for evaluation of hematuria.  States that he has had a Foley for the last 2 weeks due to urinary retention from his enlarging prostate.  Yesterday he noticed that urine was coming out on his penis rather than through the catheter.  He states he had to adjusted a little bit and then the urine was unable to flow through the catheter.  Today he noticed that he had some blood-tinged urine.  He denies any significant suprapubic pain.  Denies any nausea, vomiting or other systemic symptoms.  He denies any gross hematuria or passing of clots.  He has never had this happen before.  Past Medical History:  Diagnosis Date  . Allergy   . Enlarged prostate   . Hypertension     Patient Active Problem List   Diagnosis Date Noted  . Acute renal failure (ARF) ([facility]) 12/12/2019  . Anemia, unspecified 12/12/2019  . Enlarged prostate 12/12/2019  . 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 Nicotine dependence 03/12/2013  . HTN (hypertension) 03/18/2012  . Dental disease 03/18/2012    History reviewed. No pertinent surgical history.     Family History  Problem Relation Age of Onset  . Cancer Mother   . Cancer Father     Social History   Tobacco Use  . Smoking status: Current Every Day Smoker    Packs/day: 0.50    Types: Cigarettes  . Smokeless tobacco: Never Used  Substance Use Topics  . Alcohol use: No  . Drug use: No    Home Medications Prior to Admission medications   Medication Sig Start Date End Date Taking? Authorizing Provider  acetaminophen (TYLENOL) 500 MG tablet Take 500 mg by mouth every 6 (six) hours as needed for headache.    [provider]  amLODipine (NORVASC) 5 MG tablet Take  1 tablet (5 mg total) by mouth daily. 12/19/19   Regalado, Belkys A, MD  cephALEXin (KEFLEX) 500 MG capsule Take 1 capsule (500 mg total) by mouth 2 (two) times daily for 7 days. 12/26/19 01/02/20  Cellie Dardis S, PA-C  oxybutynin (DITROPAN) 5 MG tablet Take 1 tablet (5 mg total) by mouth every 8 (eight) hours as needed for bladder spasms. 12/18/19   Regalado, Belkys A, MD  polyethylene glycol (MIRALAX / GLYCOLAX) 17 g packet Take 17 g by mouth daily. 12/18/19   Regalado, Belkys A, MD  tamsulosin (FLOMAX) 0.4 MG CAPS capsule Take 1 capsule (0.4 mg total) by mouth daily. 12/19/19   Regalado, Cassie Freer, MD    Allergies    Patient has no known allergies.  Review of Systems   Review of Systems  Constitutional: Negative for fever.  HENT: Negative for congestion.   Eyes: Negative for visual disturbance.  Respiratory: Negative for shortness of breath.   Cardiovascular: Negative for chest pain.  Gastrointestinal: Negative for abdominal pain, constipation, diarrhea, nausea and vomiting.  Genitourinary: Positive for hematuria. Negative for difficulty urinating, dysuria, flank pain, frequency and urgency.  Musculoskeletal: Negative for back pain.  Skin: Negative for rash.  Neurological: Negative for headaches.    Physical Exam Updated Vital Signs BP (!) 144/95 (BP Location: Right Arm)   Pulse (!) 101   Temp 98.8  F (37.1 C) (Oral)   Resp 18   Ht 6\' 3"  (1.905 m)   Wt 82.7 kg   SpO2 100%   BMI 22.79 kg/m   Physical Exam Vitals and nursing note reviewed.  Constitutional:      Appearance: He is well-developed.  HENT:     Head: Normocephalic and atraumatic.  Eyes:     Conjunctiva/sclera: Conjunctivae normal.  Cardiovascular:     Rate and Rhythm: Normal rate and regular rhythm.     Heart sounds: No murmur.  Pulmonary:     Effort: Pulmonary effort is normal. No respiratory distress.     Breath sounds: Normal breath sounds.  Abdominal:     Palpations: Abdomen is soft.     Tenderness:  There is no abdominal tenderness.  Genitourinary:    Comments: Blood tinged urine noted in the foley bag.  Musculoskeletal:     Cervical back: Neck supple.  Skin:    General: Skin is warm and dry.  Neurological:     Mental Status: He is alert.     ED Results / Procedures / Treatments   Labs (all labs ordered are listed, but only abnormal results are displayed) Labs Reviewed  URINALYSIS, ROUTINE W REFLEX MICROSCOPIC - Abnormal; Notable for the following components:      Result Value   Hgb urine dipstick LARGE (*)    Protein, ur 30 (*)    Leukocytes,Ua SMALL (*)    RBC / HPF >50 (*)    Bacteria, UA RARE (*)    All other components within normal limits  URINE CULTURE    EKG None  Radiology No results found.  Procedures Procedures (including critical care time)  Medications Ordered in ED Medications - No data to display  ED Course  I have reviewed the triage vital signs and the nursing notes.  Pertinent labs & imaging results that were available during my care of the patient were reviewed by me and considered in my medical decision making (see chart for details).    MDM Rules/Calculators/A&P                      54 year old male presenting for evaluation of hematuria.  Has a chronic indwelling Foley due to prostatomegaly.  Noticed some hematuria earlier today without any other symptoms.  UA with hematuria, proteinuria, small leukocytes, greater than 50 RBCs, 6-10 WBCs and rare bacteria.  The urine culture was sent.  Findings consistent with possible urinary tract infection.  Will treat with Keflex.  His Foley was irrigated in the ED and following irrigation the urine ran clear.  He was advised to follow-up with his urologist.  He states he has an appointment with them on 12/30/52.  Advised to keep this appointment to return to ED for worsening symptoms.  Voiced understanding plan reasons to return.  All questions answered.  Patient stable for discharge.   Final  Clinical Impression(s) / ED Diagnoses Final diagnoses:  Hematuria, unspecified type  Acute cystitis with hematuria    Rx / DC Orders ED Discharge Orders         Ordered    cephALEXin (KEFLEX) 500 MG capsule  2 times daily     12/26/19 2014           02/25/20 12/26/19 2015    02/25/20, MD 12/28/19 [phone]

## 2019-12-26 NOTE — Telephone Encounter (Signed)
Patient calling with complaints of bloody in foley bag. Pt states that about a 1/3rd of the leg bag was full. Pt describes the color of the urine to be "kool aid" red. No complaints of fever or abdominal pain. Pt states that Foley was placed in the hospital due to urinary retention and enlarged prostate. Pt states that he has an appointment with the urologist on 12/31/19. Pt does not have a PCP. Pt advised to contact urologist to see if could be seen for a sooner appt. Pt advised if appt could not be moved up to seek treatment in the ED/Urgent Care. Pt verbalized understanding.  Reason for Disposition . Blood in urine  (Exception: could be normal menstrual bleeding) . [1] Bloody or red-colored urine  AND [2] no recent prostate or bladder surgery  (Exception: brief episode and urine now clear)  Answer Assessment - Initial Assessment Questions 1. SYMPTOMS: "What symptoms are you concerned about?"     Blood in leg bag 2. ONSET:  "When did the symptoms start?"     Today after getting off of work 3. FEVER: "Is there a fever?" If so, ask: "What is the temperature, how was it measured, and when did it start?"     no 4. ABDOMINAL PAIN: "Is there any abdominal pain?" (e.g., Scale 1-10; or mild, moderate, severe)     no 5. URINE COLOR: "What color is the urine?"  "Is there blood present in the urine?" (e.g., clear, yellow, cloudy, tea-colored, blood streaks, bright red)     Red, like a kool aid red 6. ONSET: "When was the catheter inserted?"     While in the hospital in 2/21 7. OTHER SYMPTOMS: "Do you have any other symptoms?" (e.g., back pain, bad urine odor)      no 8. PREGNANCY: "Is there any chance you are pregnant?" "When was your last menstrual period?"     n/a  Answer Assessment - Initial Assessment Questions 1. COLOR of URINE: "Describe the color of the urine."  (e.g., tea-colored, pink, red, blood clots, bloody)     Red, like a kool aid , less than a 3rd of the bag 2. ONSET: "When did the  bleeding start?"      A few minutes before calling 3. EPISODES: "How many times has there been blood in the urine?" or "How many times today?"     Today 4. PAIN with URINATION: "Is there any pain with passing your urine?" If so, ask: "How bad is the pain?"  (Scale 1-10; or mild, moderate, severe)    - MILD - complains slightly about urination hurting    - MODERATE - interferes with normal activities      - SEVERE - excruciating, unwilling or unable to urinate because of the pain      no 5. FEVER: "Do you have a fever?" If so, ask: "What is your temperature, how was it measured, and when did it start?"     no 6. ASSOCIATED SYMPTOMS: "Are you passing urine more frequently than usual?"     no 7. OTHER SYMPTOMS: "Do you have any other symptoms?" (e.g., back/flank pain, abdominal pain, vomiting)     no 8. PREGNANCY: "Is there any chance you are pregnant?" "When was your last menstrual period?"     n/a  Protocols used: URINE - BLOOD IN-A-AH, URINARY CATHETER SYMPTOMS AND QUESTIONS-A-AH

## 2019-12-27 LAB — URINE CULTURE

## 2019-12-30 ENCOUNTER — Telehealth: Payer: Self-pay | Admitting: Family Medicine

## 2019-12-30 NOTE — Telephone Encounter (Signed)
Pt was called and reminded of there appointment 

## 2019-12-31 ENCOUNTER — Ambulatory Visit (INDEPENDENT_AMBULATORY_CARE_PROVIDER_SITE_OTHER): Payer: Self-pay | Admitting: Family Medicine

## 2019-12-31 ENCOUNTER — Other Ambulatory Visit: Payer: Self-pay

## 2019-12-31 ENCOUNTER — Encounter: Payer: Self-pay | Admitting: Family Medicine

## 2019-12-31 VITALS — BP 133/83 | HR 77 | Temp 98.2°F | Ht 75.0 in

## 2019-12-31 DIAGNOSIS — Z7689 Persons encountering health services in other specified circumstances: Secondary | ICD-10-CM

## 2019-12-31 DIAGNOSIS — R42 Dizziness and giddiness: Secondary | ICD-10-CM

## 2019-12-31 DIAGNOSIS — R8290 Unspecified abnormal findings in urine: Secondary | ICD-10-CM

## 2019-12-31 DIAGNOSIS — N400 Enlarged prostate without lower urinary tract symptoms: Secondary | ICD-10-CM

## 2019-12-31 DIAGNOSIS — N3001 Acute cystitis with hematuria: Secondary | ICD-10-CM

## 2019-12-31 DIAGNOSIS — Z09 Encounter for follow-up examination after completed treatment for conditions other than malignant neoplasm: Secondary | ICD-10-CM

## 2019-12-31 DIAGNOSIS — Z0000 Encounter for general adult medical examination without abnormal findings: Secondary | ICD-10-CM

## 2019-12-31 DIAGNOSIS — N179 Acute kidney failure, unspecified: Secondary | ICD-10-CM

## 2019-12-31 LAB — POCT URINALYSIS DIPSTICK
Bilirubin, UA: NEGATIVE
Glucose, UA: NEGATIVE
Ketones, UA: NEGATIVE
Nitrite, UA: NEGATIVE
Protein, UA: NEGATIVE
Spec Grav, UA: 1.01 (ref 1.010–1.025)
Urobilinogen, UA: 0.2 E.U./dL
pH, UA: 5 (ref 5.0–8.0)

## 2019-12-31 LAB — GLUCOSE, POCT (MANUAL RESULT ENTRY): POC Glucose: 80 mg/dl (ref 70–99)

## 2019-12-31 LAB — POCT GLYCOSYLATED HEMOGLOBIN (HGB A1C): Hemoglobin A1C: 4.7 % (ref 4.0–5.6)

## 2019-12-31 MED ORDER — MECLIZINE HCL 25 MG PO TABS: 25.0000 mg | ORAL_TABLET | Freq: Three times a day (TID) | ORAL | 3 refills | Status: AC | PRN

## 2019-12-31 NOTE — Progress Notes (Signed)
[facility]   New Patient--Hospital Follow Up--Establish Care  Subjective:  Patient ID: John Woods, male    DOB: 20-Jul-1966  Age: 54 y.o. MRN: [id number]  CC:  Chief Complaint  Patient presents with  . New Patient (Initial Visit)    ED 12/26/2019 Cystitis    HPI John Woods is a 54 year old male presents for Hospital Follow Up and to Establish Care.   Past Medical History:  Diagnosis Date  . Allergy   . Enlarged prostate   . Hypertension    Current Status: This will be John Woods initial office visit with me. He was not previously seeing John Woods for his PCP needs. Since his Hospital visit, on 12/26/2019 for  is doing well with no complaints. He reports occasional dizziness with position changes. He denies visual changes, chest pain, cough, shortness of breath, heart palpitations, and falls. He has occasional headaches. Denies severe headaches, confusion, seizures, double vision, and blurred vision, nausea and vomiting. He denies fevers, chills, fatigue, recent infections, weight loss, and night sweats. No reports of GI problems such as diarrhea, and constipation. He has no reports of blood in stools. He has history of enlarged prostate. He denies prostate swelling, tenderness, urinary frequency, urinary urgency, dribble, discharge, dysuria, urinary itching, burning, odor, hematuria, and suprapubic pain/discomfort. No depression or anxiety reported today. He denies suicidal ideations, homicidal ideations, or auditory hallucinations. He denies pain today.   History reviewed. No pertinent surgical history.  Family History  Problem Relation Age of Onset  . Cancer Mother   . Cancer Father     Social History   Socioeconomic History  . Marital status: Single    Spouse name: Not on file  . Number of children: Not on file  . Years of education: Not on file  . Highest education level: Not on file  Occupational History  . Not on  file  Tobacco Use  . Smoking status: Current Every Day Smoker    Packs/day: 0.50    Types: Cigarettes  . Smokeless tobacco: Never Used  Substance and Sexual Activity  . Alcohol use: No  . Drug use: No  . Sexual activity: Yes  Other Topics Concern  . Not on file  Social History Narrative  . Not on file   Social Determinants of Health   Financial Resource Strain:   . Difficulty of Paying Living Expenses:   Food Insecurity:   . Worried About Programme researcher, broadcasting/film/video in the Last Year:   . Barista in the Last Year:   Transportation Needs:   . Freight forwarder (Medical):   Marland Kitchen Lack of Transportation (Non-Medical):   Physical Activity:   . Days of Exercise per Week:   . Minutes of Exercise per Session:   Stress:   . Feeling of Stress :   Social Connections:   . Frequency of Communication with Friends and Family:   . Frequency of Social Gatherings with Friends and Family:   . Attends Religious Services:   . Active Member of Clubs or Organizations:   . Attends Banker Meetings:   Marland Kitchen Marital Status:   Intimate Partner Violence:   . Fear of Current or Ex-Partner:   . Emotionally Abused:   Marland Kitchen Physically Abused:   . Sexually Abused:     Outpatient Medications Prior to Visit  Medication Sig Dispense Refill  . acetaminophen (TYLENOL) 500 MG tablet Take 500  mg by mouth every 6 (six) hours as needed for headache.    Marland Kitchen amLODipine (NORVASC) 5 MG tablet Take 1 tablet (5 mg total) by mouth daily. 30 tablet 0  . cephALEXin (KEFLEX) 500 MG capsule Take 1 capsule (500 mg total) by mouth 2 (two) times daily for 7 days. 14 capsule 0  . oxybutynin (DITROPAN) 5 MG tablet Take 1 tablet (5 mg total) by mouth every 8 (eight) hours as needed for bladder spasms. 30 tablet 0  . tamsulosin (FLOMAX) 0.4 MG CAPS capsule Take 1 capsule (0.4 mg total) by mouth daily. 30 capsule 3  . polyethylene glycol (MIRALAX / GLYCOLAX) 17 g packet Take 17 g by mouth daily. 14 each 0   No  facility-administered medications prior to visit.    No Known Allergies  ROS Review of Systems  Constitutional: Negative.   HENT: Negative.   Eyes: Negative.   Respiratory: Negative.   Cardiovascular: Negative.   Gastrointestinal: Negative.   Endocrine: Negative.   Genitourinary: Negative.        Foley Catheter.   Musculoskeletal: Negative.   Skin: Negative.   Allergic/Immunologic: Negative.   Neurological: Positive for dizziness (occasional) and headaches (occasional).  Hematological: Negative.   Psychiatric/Behavioral: Negative.    Objective:    Physical Exam  Constitutional: He is oriented to person, place, and time. He appears well-developed and well-nourished.  HENT:  Head: Normocephalic and atraumatic.  Eyes: Conjunctivae are normal.  Cardiovascular: Normal rate, regular rhythm, normal heart sounds and intact distal pulses.  Pulmonary/Chest: Effort normal and breath sounds normal.  Abdominal: Soft. Bowel sounds are normal.  Musculoskeletal:        General: Normal range of motion.     Cervical back: Normal range of motion and neck supple.  Neurological: He is alert and oriented to person, place, and time. He has normal reflexes.  Skin: Skin is warm and dry.  Psychiatric: He has a normal mood and affect. His behavior is normal. Thought content normal.  Nursing note and vitals reviewed.   BP 133/83   Pulse 77   Temp 98.2 F (36.8 C)   Ht 6\' 3"  (1.905 m)   SpO2 99%   BMI 22.79 kg/m  Wt Readings from Last 3 Encounters:  12/26/19 182 lb 4.8 oz (82.7 kg)  12/18/19 182 lb 4.8 oz (82.7 kg)  11/16/19 210 lb (95.3 kg)     Health Maintenance Due  Topic Date Due  . COLONOSCOPY  Never done  . INFLUENZA VACCINE  Never done    There are no preventive care reminders to display for this patient.  Lab Results  Component Value Date   TSH 0.075 (L) 12/31/2019   Lab Results  Component Value Date   WBC 4.8 12/31/2019   HGB 10.3 (L) 12/31/2019   HCT 30.7 (L)  12/31/2019   MCV 95 12/31/2019   PLT 231 12/31/2019   Lab Results  Component Value Date   NA 139 12/31/2019   K 5.0 12/31/2019   CO2 25 12/31/2019   GLUCOSE 86 12/31/2019   BUN 15 12/31/2019   CREATININE 1.75 (H) 12/31/2019   BILITOT 0.4 12/31/2019   ALKPHOS 73 12/31/2019   AST 18 12/31/2019   ALT 22 12/31/2019   PROT 7.0 12/31/2019   ALBUMIN 4.5 12/31/2019   CALCIUM 9.7 12/31/2019   ANIONGAP 9 12/18/2019   Lab Results  Component Value Date   CHOL 141 12/31/2019   Lab Results  Component Value Date   HDL 53 12/31/2019  Lab Results  Component Value Date   LDLCALC 76 12/31/2019   Lab Results  Component Value Date   TRIG 58 12/31/2019   Lab Results  Component Value Date   CHOLHDL 2.7 12/31/2019   Lab Results  Component Value Date   HGBA1C 4.7 12/31/2019      Assessment & Plan:   1. Hospital discharge follow-up  2. Encounter to establish care  3. Acute renal failure, unspecified acute renal failure type (HCC) Resolved.   4. Acute cystitis with hematuria  5. Enlarged prostate Stable with no complaints. We will re-evaluate PSA today.    6. Healthcare maintenance - POCT glycosylated hemoglobin (Hb A1C) - POCT urinalysis dipstick - POCT glucose (manual entry) - CBC with Differential - Comprehensive metabolic panel - Lipid Panel - TSH - Vitamin B12 - Vitamin D, 25-hydroxy - PSA  7. Abnormal urinalysis Results are pending.  - Urine Culture  8. Dizziness We will initiate Meclizine today.  - meclizine (ANTIVERT) 25 MG tablet; Take 1 tablet (25 mg total) by mouth 3 (three) times daily as needed for dizziness.  Dispense: 90 tablet; Refill: 3  9. Follow up He will follow up in 1 month.   Meds ordered this encounter  Medications  . meclizine (ANTIVERT) 25 MG tablet    Sig: Take 1 tablet (25 mg total) by mouth 3 (three) times daily as needed for dizziness.    Dispense:  90 tablet    Refill:  3    Orders Placed This Encounter  Procedures  .  Urine Culture  . CBC with Differential  . Comprehensive metabolic panel  . Lipid Panel  . TSH  . Vitamin B12  . Vitamin D, 25-hydroxy  . PSA  . POCT glycosylated hemoglobin (Hb A1C)  . POCT urinalysis dipstick  . POCT glucose (manual entry)    Referral Orders  No referral(s) requested today    Raliegh Ip,  MSN, FNP-BC [facility] [address] [place], [place] [address] [phone] fax  Problem List Items Addressed This Visit      Genitourinary   Acute renal failure (ARF) (HCC)     Other   Enlarged prostate    Other Visit Diagnoses    Hospital discharge follow-up    -  Primary   Encounter to establish care       Acute cystitis with hematuria       Dizziness       Relevant Medications   meclizine (ANTIVERT) 25 MG tablet   Abnormal urinalysis       Relevant Orders   Urine Culture (Completed)   Healthcare maintenance       Relevant Orders   POCT glycosylated hemoglobin (Hb A1C) (Completed)   POCT urinalysis dipstick (Completed)   POCT glucose (manual entry) (Completed)   CBC with Differential (Completed)   Comprehensive metabolic panel (Completed)   Lipid Panel (Completed)   TSH (Completed)   Vitamin B12 (Completed)   Vitamin D, 25-hydroxy (Completed)   PSA (Completed)   Follow up          Meds ordered this encounter  Medications  . meclizine (ANTIVERT) 25 MG tablet    Sig: Take 1 tablet (25 mg total) by mouth 3 (three) times daily as needed for dizziness.    Dispense:  90 tablet    Refill:  3    Follow-up: Return in about 1 month (around 01/31/2020).    Kallie Locks,  FNP 

## 2020-01-01 LAB — TSH: TSH: 0.075 u[IU]/mL — ABNORMAL LOW (ref 0.450–4.500)

## 2020-01-01 LAB — CBC WITH DIFFERENTIAL/PLATELET
Basophils Absolute: 0 10*3/uL (ref 0.0–0.2)
Basos: 1 %
EOS (ABSOLUTE): 0.1 10*3/uL (ref 0.0–0.4)
Eos: 2 %
Hematocrit: 30.7 % — ABNORMAL LOW (ref 37.5–51.0)
Hemoglobin: 10.3 g/dL — ABNORMAL LOW (ref 13.0–17.7)
Immature Grans (Abs): 0 10*3/uL (ref 0.0–0.1)
Immature Granulocytes: 0 %
Lymphocytes Absolute: 1.3 10*3/uL (ref 0.7–3.1)
Lymphs: 27 %
MCH: 31.7 pg (ref 26.6–33.0)
MCHC: 33.6 g/dL (ref 31.5–35.7)
MCV: 95 fL (ref 79–97)
Monocytes Absolute: 0.5 10*3/uL (ref 0.1–0.9)
Monocytes: 11 %
Neutrophils Absolute: 2.9 10*3/uL (ref 1.4–7.0)
Neutrophils: 59 %
Platelets: 231 10*3/uL (ref 150–450)
RBC: 3.25 x10E6/uL — ABNORMAL LOW (ref 4.14–5.80)
RDW: 11.2 % — ABNORMAL LOW (ref 11.6–15.4)
WBC: 4.8 10*3/uL (ref 3.4–10.8)

## 2020-01-01 LAB — COMPREHENSIVE METABOLIC PANEL
ALT: 22 IU/L (ref 0–44)
AST: 18 IU/L (ref 0–40)
Albumin/Globulin Ratio: 1.8 (ref 1.2–2.2)
Albumin: 4.5 g/dL (ref 3.8–4.9)
Alkaline Phosphatase: 73 IU/L (ref 39–117)
BUN/Creatinine Ratio: 9 (ref 9–20)
BUN: 15 mg/dL (ref 6–24)
Bilirubin Total: 0.4 mg/dL (ref 0.0–1.2)
CO2: 25 mmol/L (ref 20–29)
Calcium: 9.7 mg/dL (ref 8.7–10.2)
Chloride: 104 mmol/L (ref 96–106)
Creatinine, Ser: 1.75 mg/dL — ABNORMAL HIGH (ref 0.76–1.27)
GFR calc Af Amer: 50 mL/min/{1.73_m2} — ABNORMAL LOW (ref >59)
GFR calc non Af Amer: 43 mL/min/{1.73_m2} — ABNORMAL LOW (ref >59)
Globulin, Total: 2.5 g/dL (ref 1.5–4.5)
Glucose: 86 mg/dL (ref 65–99)
Potassium: 5 mmol/L (ref 3.5–5.2)
Sodium: 139 mmol/L (ref 134–144)
Total Protein: 7 g/dL (ref 6.0–8.5)

## 2020-01-01 LAB — LIPID PANEL
Chol/HDL Ratio: 2.7 ratio (ref 0.0–5.0)
Cholesterol, Total: 141 mg/dL (ref 100–199)
HDL: 53 mg/dL (ref >39)
LDL Chol Calc (NIH): 76 mg/dL (ref 0–99)
Triglycerides: 58 mg/dL (ref 0–149)
VLDL Cholesterol Cal: 12 mg/dL (ref 5–40)

## 2020-01-01 LAB — VITAMIN B12: Vitamin B-12: 688 pg/mL (ref 232–1245)

## 2020-01-01 LAB — PSA: Prostate Specific Ag, Serum: 8.8 ng/mL — ABNORMAL HIGH (ref 0.0–4.0)

## 2020-01-01 LAB — VITAMIN D 25 HYDROXY (VIT D DEFICIENCY, FRACTURES): Vit D, 25-Hydroxy: 13.6 ng/mL — ABNORMAL LOW (ref 30.0–100.0)

## 2020-01-02 ENCOUNTER — Encounter: Payer: Self-pay | Admitting: Family Medicine

## 2020-01-02 DIAGNOSIS — N3001 Acute cystitis with hematuria: Secondary | ICD-10-CM | POA: Insufficient documentation

## 2020-01-02 DIAGNOSIS — R42 Dizziness and giddiness: Secondary | ICD-10-CM | POA: Insufficient documentation

## 2020-01-04 LAB — URINE CULTURE

## 2020-01-07 ENCOUNTER — Other Ambulatory Visit: Payer: Self-pay | Admitting: Family Medicine

## 2020-01-07 ENCOUNTER — Encounter: Payer: Self-pay | Admitting: Family Medicine

## 2020-01-07 DIAGNOSIS — E509 Vitamin A deficiency, unspecified: Secondary | ICD-10-CM

## 2020-01-07 DIAGNOSIS — R7989 Other specified abnormal findings of blood chemistry: Secondary | ICD-10-CM

## 2020-01-07 DIAGNOSIS — E559 Vitamin D deficiency, unspecified: Secondary | ICD-10-CM

## 2020-01-07 MED ORDER — VITAMIN D (ERGOCALCIFEROL) 1.25 MG (50000 UNIT) PO CAPS: 50000.0000 [IU] | ORAL_CAPSULE | ORAL | 6 refills | Status: AC

## 2020-01-23 ENCOUNTER — Other Ambulatory Visit: Payer: Self-pay

## 2020-01-23 ENCOUNTER — Telehealth: Payer: Self-pay | Admitting: General Practice

## 2020-01-23 DIAGNOSIS — R7989 Other specified abnormal findings of blood chemistry: Secondary | ICD-10-CM

## 2020-01-23 DIAGNOSIS — E0590 Thyrotoxicosis, unspecified without thyrotoxic crisis or storm: Secondary | ICD-10-CM

## 2020-01-23 HISTORY — DX: Thyrotoxicosis, unspecified without thyrotoxic crisis or storm: E05.90

## 2020-01-23 NOTE — Telephone Encounter (Signed)
Please call pt back regarding thyroid and bp meds.

## 2020-01-23 NOTE — Telephone Encounter (Signed)
Message left for call back for more details.  

## 2020-01-24 LAB — T4, FREE: Free T4: 1.34 ng/dL (ref 0.82–1.77)

## 2020-01-24 LAB — THYROID PANEL WITH TSH
Free Thyroxine Index: 2.5 (ref 1.2–4.9)
T3 Uptake Ratio: 25 % (ref 24–39)
T4, Total: 10.1 ug/dL (ref 4.5–12.0)
TSH: 0.026 u[IU]/mL — ABNORMAL LOW (ref 0.450–4.500)

## 2020-01-24 LAB — T3, FREE: T3, Free: 3.3 pg/mL (ref 2.0–4.4)

## 2020-01-27 ENCOUNTER — Telehealth: Payer: Self-pay

## 2020-01-27 ENCOUNTER — Other Ambulatory Visit: Payer: Self-pay | Admitting: Family Medicine

## 2020-01-27 ENCOUNTER — Encounter: Payer: Self-pay | Admitting: Family Medicine

## 2020-01-27 ENCOUNTER — Other Ambulatory Visit: Payer: Self-pay

## 2020-01-27 DIAGNOSIS — E0590 Thyrotoxicosis, unspecified without thyrotoxic crisis or storm: Secondary | ICD-10-CM

## 2020-01-27 DIAGNOSIS — I10 Essential (primary) hypertension: Secondary | ICD-10-CM

## 2020-01-27 MED ORDER — AMLODIPINE BESYLATE 5 MG PO TABS: 5.0000 mg | ORAL_TABLET | Freq: Every day | ORAL | 0 refills | Status: DC

## 2020-01-27 MED ORDER — AMLODIPINE BESYLATE 5 MG PO TABS: 5.0000 mg | ORAL_TABLET | Freq: Every day | ORAL | 3 refills | Status: DC

## 2020-01-27 MED ORDER — LEVOTHYROXINE SODIUM 125 MCG PO TABS: 125.0000 ug | ORAL_TABLET | Freq: Every day | ORAL | 3 refills | Status: DC

## 2020-01-27 MED ORDER — METHIMAZOLE 5 MG PO TABS: 5.0000 mg | ORAL_TABLET | Freq: Three times a day (TID) | ORAL | 2 refills | Status: DC

## 2020-01-27 NOTE — Telephone Encounter (Signed)
John Woods recently had his thyroid levels checked.  Patient is waiting on thyroid medication to be prescribed. Please advise.

## 2020-01-29 ENCOUNTER — Telehealth: Payer: Self-pay

## 2020-01-29 NOTE — Telephone Encounter (Signed)
Patient called for his lab results and thyroid medicine.

## 2020-01-31 ENCOUNTER — Other Ambulatory Visit: Payer: Self-pay

## 2020-01-31 ENCOUNTER — Ambulatory Visit (INDEPENDENT_AMBULATORY_CARE_PROVIDER_SITE_OTHER): Payer: Self-pay | Admitting: Family Medicine

## 2020-01-31 ENCOUNTER — Encounter: Payer: Self-pay | Admitting: Family Medicine

## 2020-01-31 VITALS — BP 122/83 | HR 73 | Temp 98.3°F | Ht 75.0 in | Wt 192.4 lb

## 2020-01-31 DIAGNOSIS — N400 Enlarged prostate without lower urinary tract symptoms: Secondary | ICD-10-CM

## 2020-01-31 DIAGNOSIS — Z09 Encounter for follow-up examination after completed treatment for conditions other than malignant neoplasm: Secondary | ICD-10-CM

## 2020-01-31 DIAGNOSIS — R42 Dizziness and giddiness: Secondary | ICD-10-CM

## 2020-01-31 DIAGNOSIS — E559 Vitamin D deficiency, unspecified: Secondary | ICD-10-CM

## 2020-01-31 DIAGNOSIS — I10 Essential (primary) hypertension: Secondary | ICD-10-CM

## 2020-01-31 DIAGNOSIS — E0590 Thyrotoxicosis, unspecified without thyrotoxic crisis or storm: Secondary | ICD-10-CM

## 2020-01-31 DIAGNOSIS — R8290 Unspecified abnormal findings in urine: Secondary | ICD-10-CM

## 2020-01-31 LAB — POCT URINALYSIS DIPSTICK
Bilirubin, UA: NEGATIVE
Blood, UA: NEGATIVE
Glucose, UA: NEGATIVE
Ketones, UA: NEGATIVE
Nitrite, UA: NEGATIVE
Protein, UA: NEGATIVE
Spec Grav, UA: 1.02 (ref 1.010–1.025)
Urobilinogen, UA: 0.2 E.U./dL
pH, UA: 6.5 (ref 5.0–8.0)

## 2020-01-31 NOTE — Progress Notes (Signed)
[facility]   Established Patient Office Visit  Subjective:  Patient ID: John Woods, male    DOB: 03-25-1966  Age: 54 y.o. MRN: [id number]  CC:  Chief Complaint  Patient presents with  . Follow-up    HPI RAHSAAN Woods is a 54 year old male who presents for Follow Up today.   Past Medical History:  Diagnosis Date  . Acute cystitis with hematuria 12/2019  . Acute renal failure (ARF) ([place]) 11/2019  . Allergy   . Decreased thyroid stimulating hormone (TSH) level 12/2019  . Dizziness   . Elevated PSA, less than 10 ng/ml 12/2019  . Enlarged prostate   . Hypertension   . Hyperthyroidism 01/2020  . Vitamin D deficiency 12/2019    Current Status: Since he last office visit, he is doing well with no complaints. He denies visual changes, chest pain, cough, shortness of breath, heart palpitations, and falls. He has occasional headaches and dizziness with position changes. Denies severe headaches, confusion, seizures, double vision, and blurred vision, nausea and vomiting. He denies fevers, chills, fatigue, recent infections, weight loss, and night sweats. No reports of GI problems such as diarrhea, and constipation. He has no reports of blood in stools, dysuria and hematuria. No depression or anxiety reported today. He denies suicidal ideations, homicidal ideations, or auditory hallucinations. He is taking. all medications as prescribed. He denies pain today.   History reviewed. No pertinent surgical history.  Family History  Problem Relation Age of Onset  . Cancer Mother   . Cancer Father     Social History   Socioeconomic History  . Marital status: Single    Spouse name: Not on file  . Number of children: Not on file  . Years of education: Not on file  . Highest education level: Not on file  Occupational History  . Not on file  Tobacco Use  . Smoking status: Current Every Day Smoker    Packs/day: 0.50    Types: Cigarettes    . Smokeless tobacco: Never Used  Substance and Sexual Activity  . Alcohol use: No  . Drug use: No  . Sexual activity: Yes  Other Topics Concern  . Not on file  Social History Narrative  . Not on file   Social Determinants of Health   Financial Resource Strain:   . Difficulty of Paying Living Expenses:   Food Insecurity:   . Worried About Charity fundraiser in the Last Year:   . Arboriculturist in the Last Year:   Transportation Needs:   . Film/video editor (Medical):   Marland Kitchen Lack of Transportation (Non-Medical):   Physical Activity:   . Days of Exercise per Week:   . Minutes of Exercise per Session:   Stress:   . Feeling of Stress :   Social Connections:   . Frequency of Communication with Friends and Family:   . Frequency of Social Gatherings with Friends and Family:   . Attends Religious Services:   . Active Member of Clubs or Organizations:   . Attends Archivist Meetings:   Marland Kitchen Marital Status:   Intimate Partner Violence:   . Fear of Current or Ex-Partner:   . Emotionally Abused:   Marland Kitchen Physically Abused:   . Sexually Abused:     Outpatient Medications Prior to Visit  Medication Sig Dispense Refill  . acetaminophen (TYLENOL) 500 MG tablet Take 500 mg by mouth every 6 (six)  hours as needed for headache.    Marland Kitchen amLODipine (NORVASC) 5 MG tablet Take 1 tablet (5 mg total) by mouth daily. 30 tablet 3  . meclizine (ANTIVERT) 25 MG tablet Take 1 tablet (25 mg total) by mouth 3 (three) times daily as needed for dizziness. 90 tablet 3  . methimazole (TAPAZOLE) 5 MG tablet Take 1 tablet (5 mg total) by mouth 3 (three) times daily. 90 tablet 2  . tamsulosin (FLOMAX) 0.4 MG CAPS capsule Take 1 capsule (0.4 mg total) by mouth daily. 30 capsule 3  . Vitamin D, Ergocalciferol, (DRISDOL) 1.25 MG (50000 UNIT) CAPS capsule Take 1 capsule (50,000 Units total) by mouth every 7 (seven) days. 5 capsule 6  . oxybutynin (DITROPAN) 5 MG tablet Take 1 tablet (5 mg total) by mouth  every 8 (eight) hours as needed for bladder spasms. 30 tablet 0   No facility-administered medications prior to visit.    No Known Allergies  ROS Review of Systems  Constitutional: Negative.   HENT: Negative.   Eyes: Negative.   Respiratory: Negative.   Cardiovascular: Negative.   Gastrointestinal: Negative.   Endocrine: Negative.   Genitourinary: Positive for frequency.  Musculoskeletal: Negative.   Skin: Negative.   Allergic/Immunologic: Negative.   Neurological: Positive for dizziness (occasional ) and headaches (occasional ).  Hematological: Negative.   Psychiatric/Behavioral: Negative.       Objective:    Physical Exam  Constitutional: He is oriented to person, place, and time. He appears well-developed and well-nourished.  HENT:  Head: Normocephalic and atraumatic.  Eyes: Conjunctivae are normal.  Cardiovascular: Normal rate, regular rhythm, normal heart sounds and intact distal pulses.  Pulmonary/Chest: Effort normal and breath sounds normal.  Abdominal: Soft. Bowel sounds are normal.  Musculoskeletal:        General: Normal range of motion.     Cervical back: Normal range of motion and neck supple.  Neurological: He is alert and oriented to person, place, and time. He has normal reflexes.  Skin: Skin is warm and dry.  Psychiatric: He has a normal mood and affect. His behavior is normal. Judgment and thought content normal.  Nursing note and vitals reviewed.   BP 122/83   Pulse 73   Temp 98.3 F (36.8 C) (Oral)   Ht 6\' 3"  (1.905 m)   Wt 192 lb 6.4 oz (87.3 kg)   SpO2 100%   BMI 24.05 kg/m  Wt Readings from Last 3 Encounters:  01/31/20 192 lb 6.4 oz (87.3 kg)  12/26/19 182 lb 4.8 oz (82.7 kg)  12/18/19 182 lb 4.8 oz (82.7 kg)     Health Maintenance Due  Topic Date Due  . COLONOSCOPY  Never done    There are no preventive care reminders to display for this patient.  Lab Results  Component Value Date   TSH 0.026 (L) 01/23/2020   Lab Results   Component Value Date   WBC 4.8 12/31/2019   HGB 10.3 (L) 12/31/2019   HCT 30.7 (L) 12/31/2019   MCV 95 12/31/2019   PLT 231 12/31/2019   Lab Results  Component Value Date   NA 139 12/31/2019   K 5.0 12/31/2019   CO2 25 12/31/2019   GLUCOSE 86 12/31/2019   BUN 15 12/31/2019   CREATININE 1.75 (H) 12/31/2019   BILITOT 0.4 12/31/2019   ALKPHOS 73 12/31/2019   AST 18 12/31/2019   ALT 22 12/31/2019   PROT 7.0 12/31/2019   ALBUMIN 4.5 12/31/2019   CALCIUM 9.7 12/31/2019  ANIONGAP 9 12/18/2019   Lab Results  Component Value Date   CHOL 141 12/31/2019   Lab Results  Component Value Date   HDL 53 12/31/2019   Lab Results  Component Value Date   LDLCALC 76 12/31/2019   Lab Results  Component Value Date   TRIG 58 12/31/2019   Lab Results  Component Value Date   CHOLHDL 2.7 12/31/2019   Lab Results  Component Value Date   HGBA1C 4.7 12/31/2019      Assessment & Plan:   1. Essential hypertension The current medical regimen is effective; blood pressure is stable at 122/83 today; continue present plan and medications as prescribed. He will continue to take medications as prescribed, to decrease high sodium intake, excessive alcohol intake, increase potassium intake, smoking cessation, and increase physical activity of at least 30 minutes of cardio activity daily. He will continue to follow Heart Healthy or DASH diet. - POCT urinalysis dipstick  2. Hyperthyroidism  3. Vitamin D deficiency  4. Enlarged prostate He will continue to follow up with Urology as needed.   5. Dizziness  6. Follow up He will follow up in 3 months.  No orders of the defined types were placed in this encounter.   Orders Placed This Encounter  Procedures  . POCT urinalysis dipstick    Referral Orders  No referral(s) requested today    Raliegh Ip,  MSN, FNP-BC [facility] [address] [place], [place]  [address] [phone] fax     Problem List Items Addressed This Visit      Cardiovascular and Mediastinum   HTN (hypertension) - Primary   Relevant Orders   POCT urinalysis dipstick (Completed)     Other   Dizziness   Enlarged prostate    Other Visit Diagnoses    Hyperthyroidism       Vitamin D deficiency       Follow up          No orders of the defined types were placed in this encounter.   Follow-up: No follow-ups on file.    Kallie Locks, FNP

## 2020-02-01 DIAGNOSIS — E559 Vitamin D deficiency, unspecified: Secondary | ICD-10-CM | POA: Insufficient documentation

## 2020-02-01 DIAGNOSIS — E0590 Thyrotoxicosis, unspecified without thyrotoxic crisis or storm: Secondary | ICD-10-CM | POA: Insufficient documentation

## 2020-02-02 LAB — URINE CULTURE: Organism ID, Bacteria: NO GROWTH

## 2020-04-16 ENCOUNTER — Other Ambulatory Visit: Payer: Self-pay | Admitting: Family Medicine

## 2020-04-16 DIAGNOSIS — E0590 Thyrotoxicosis, unspecified without thyrotoxic crisis or storm: Secondary | ICD-10-CM

## 2020-05-01 ENCOUNTER — Ambulatory Visit: Payer: Self-pay | Admitting: Family Medicine

## 2020-05-08 ENCOUNTER — Ambulatory Visit (INDEPENDENT_AMBULATORY_CARE_PROVIDER_SITE_OTHER): Payer: Self-pay | Admitting: Family Medicine

## 2020-05-08 VITALS — BP 142/85 | HR 83 | Temp 97.2°F | Ht 75.0 in | Wt 192.0 lb

## 2020-05-08 DIAGNOSIS — I10 Essential (primary) hypertension: Secondary | ICD-10-CM

## 2020-05-08 DIAGNOSIS — E0590 Thyrotoxicosis, unspecified without thyrotoxic crisis or storm: Secondary | ICD-10-CM

## 2020-05-08 DIAGNOSIS — N400 Enlarged prostate without lower urinary tract symptoms: Secondary | ICD-10-CM

## 2020-05-08 DIAGNOSIS — R972 Elevated prostate specific antigen [PSA]: Secondary | ICD-10-CM

## 2020-05-08 DIAGNOSIS — R42 Dizziness and giddiness: Secondary | ICD-10-CM

## 2020-05-08 DIAGNOSIS — Z09 Encounter for follow-up examination after completed treatment for conditions other than malignant neoplasm: Secondary | ICD-10-CM

## 2020-05-08 MED ORDER — METHIMAZOLE 5 MG PO TABS: 5.0000 mg | ORAL_TABLET | Freq: Three times a day (TID) | ORAL | 3 refills | Status: AC

## 2020-05-08 MED ORDER — TAMSULOSIN HCL 0.4 MG PO CAPS: 0.4000 mg | ORAL_CAPSULE | Freq: Every day | ORAL | 3 refills | Status: AC

## 2020-05-08 MED ORDER — AMLODIPINE BESYLATE 5 MG PO TABS: 5.0000 mg | ORAL_TABLET | Freq: Every day | ORAL | 3 refills | Status: AC

## 2020-05-08 NOTE — Progress Notes (Signed)
[facility]   Established Patient Office Visit  Subjective:  Patient ID: John Woods, male    DOB: 12-07-1965  Age: 54 y.o. MRN: [id number]  CC:  Chief Complaint  Patient presents with  . Follow-up    3 month follow up, medication refills.     HPI John Woods is a 14 year presents for Follow Up today.     Patient Active Problem List   Diagnosis Date Noted  . Hyperthyroidism 02/01/2020  . Vitamin D deficiency 02/01/2020  . Dizziness 01/02/2020  . Acute cystitis with hematuria 01/02/2020  . Acute renal failure (ARF) (HCC) 12/12/2019  . Anemia, unspecified 12/12/2019  . Enlarged prostate 12/12/2019  . 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 Nicotine dependence 03/12/2013  . HTN (hypertension) 03/18/2012  . Dental disease 03/18/2012    Past Medical History:  Diagnosis Date  . Acute cystitis with hematuria 12/2019  . Acute renal failure (ARF) (HCC) 11/2019  . Allergy   . Decreased thyroid stimulating hormone (TSH) level 12/2019  . Dizziness   . Elevated PSA, less than 10 ng/ml 12/2019  . Enlarged prostate   . Hypertension   . Hyperthyroidism 01/2020  . Vitamin D deficiency 12/2019   Current Status: Since his last office visit, he is doing well with no complaints. He denies visual changes, chest pain, cough, shortness of breath, heart palpitations, and falls. He has occasional headaches and dizziness with position changes. Denies severe headaches, confusion, seizures, double vision, and blurred vision, nausea and vomiting. Elevated prostate. He continues to follow up with Urology as needed. He denies fevers, chills, fatigue, recent infections, weight loss, and night sweats. Denies GI problems such as diarrhea, and constipation. He has no reports of blood in stools, dysuria and hematuria. No depression or anxiety reported today. He denies suicidal ideations, homicidal ideations, or auditory hallucinations. He is taking all medications as  prescribed. He denies pain today.   No past surgical history on file.  Family History  Problem Relation Age of Onset  . Cancer Mother   . Cancer Father     Social History   Socioeconomic History  . Marital status: Single    Spouse name: Not on file  . Number of children: Not on file  . Years of education: Not on file  . Highest education level: Not on file  Occupational History  . Not on file  Tobacco Use  . Smoking status: Current Every Day Smoker    Packs/day: 0.50    Types: Cigarettes  . Smokeless tobacco: Never Used  Vaping Use  . Vaping Use: Never used  Substance and Sexual Activity  . Alcohol use: No  . Drug use: No  . Sexual activity: Yes  Other Topics Concern  . Not on file  Social History Narrative  . Not on file   Social Determinants of Health   Financial Resource Strain:   . Difficulty of Paying Living Expenses:   Food Insecurity:   . Worried About Programme researcher, broadcasting/film/video in the Last Year:   . Barista in the Last Year:   Transportation Needs:   . Freight forwarder (Medical):   Marland Kitchen Lack of Transportation (Non-Medical):   Physical Activity:   . Days of Exercise per Week:   . Minutes of Exercise per Session:   Stress:   . Feeling of Stress :   Social Connections:   . Frequency of Communication with Friends and Family:   .  Frequency of Social Gatherings with Friends and Family:   . Attends Religious Services:   . Active Member of Clubs or Organizations:   . Attends Banker Meetings:   Marland Kitchen Marital Status:   Intimate Partner Violence:   . Fear of Current or Ex-Partner:   . Emotionally Abused:   Marland Kitchen Physically Abused:   . Sexually Abused:     Outpatient Medications Prior to Visit  Medication Sig Dispense Refill  . Vitamin D, Ergocalciferol, (DRISDOL) 1.25 MG (50000 UNIT) CAPS capsule Take 1 capsule (50,000 Units total) by mouth every 7 (seven) days. 5 capsule 6  . amLODipine (NORVASC) 5 MG tablet Take 1 tablet (5 mg total) by  mouth daily. 30 tablet 3  . methimazole (TAPAZOLE) 5 MG tablet Take 1 tablet (5 mg total) by mouth 3 (three) times daily. 90 tablet 2  . tamsulosin (FLOMAX) 0.4 MG CAPS capsule Take 1 capsule (0.4 mg total) by mouth daily. 30 capsule 3  . acetaminophen (TYLENOL) 500 MG tablet Take 500 mg by mouth every 6 (six) hours as needed for headache. (Patient not taking: Reported on 05/08/2020)    . meclizine (ANTIVERT) 25 MG tablet Take 1 tablet (25 mg total) by mouth 3 (three) times daily as needed for dizziness. (Patient not taking: Reported on 05/08/2020) 90 tablet 3   No facility-administered medications prior to visit.    No Known Allergies  ROS Review of Systems  Constitutional: Negative.   HENT: Negative.   Eyes: Negative.   Respiratory: Negative.   Cardiovascular: Negative.   Gastrointestinal: Negative.   Endocrine: Negative.   Genitourinary: Positive for urgency.  Musculoskeletal: Negative.   Skin: Negative.   Allergic/Immunologic: Negative.   Neurological: Negative.   Hematological: Negative.   Psychiatric/Behavioral: Negative.       Objective:    Physical Exam Constitutional:      Appearance: Normal appearance.  Cardiovascular:     Rate and Rhythm: Normal rate and regular rhythm.     Pulses: Normal pulses.     Heart sounds: Normal heart sounds.  Pulmonary:     Effort: Pulmonary effort is normal.     Breath sounds: Normal breath sounds.  Musculoskeletal:        General: Normal range of motion.     Cervical back: Normal range of motion and neck supple.  Skin:    General: Skin is warm and dry.  Neurological:     General: No focal deficit present.     Mental Status: He is alert and oriented to person, place, and time.     BP (!) 142/85   Pulse 83   Temp (!) 97.2 F (36.2 C)   Ht 6\' 3"  (1.905 m)   Wt 192 lb 0.6 oz (87.1 kg)   SpO2 100%   BMI 24.00 kg/m  Wt Readings from Last 3 Encounters:  05/08/20 192 lb 0.6 oz (87.1 kg)  01/31/20 192 lb 6.4 oz (87.3 kg)    12/26/19 182 lb 4.8 oz (82.7 kg)     Health Maintenance Due  Topic Date Due  . Hepatitis C Screening  Never done  . COVID-19 Vaccine (1) Never done  . COLONOSCOPY  Never done    There are no preventive care reminders to display for this patient.  Lab Results  Component Value Date   TSH 0.026 (L) 01/23/2020   Lab Results  Component Value Date   WBC 4.8 12/31/2019   HGB 10.3 (L) 12/31/2019   HCT 30.7 (L) 12/31/2019  MCV 95 12/31/2019   PLT 231 12/31/2019   Lab Results  Component Value Date   NA 139 12/31/2019   K 5.0 12/31/2019   CO2 25 12/31/2019   GLUCOSE 86 12/31/2019   BUN 15 12/31/2019   CREATININE 1.75 (H) 12/31/2019   BILITOT 0.4 12/31/2019   ALKPHOS 73 12/31/2019   AST 18 12/31/2019   ALT 22 12/31/2019   PROT 7.0 12/31/2019   ALBUMIN 4.5 12/31/2019   CALCIUM 9.7 12/31/2019   ANIONGAP 9 12/18/2019   Lab Results  Component Value Date   CHOL 141 12/31/2019   Lab Results  Component Value Date   HDL 53 12/31/2019   Lab Results  Component Value Date   LDLCALC 76 12/31/2019   Lab Results  Component Value Date   TRIG 58 12/31/2019   Lab Results  Component Value Date   CHOLHDL 2.7 12/31/2019   Lab Results  Component Value Date   HGBA1C 4.7 12/31/2019      Assessment & Plan:   1. Essential hypertension The current medical regimen is effective; blood pressure is stable at 142/85 today; continue present plan and medications as prescribed. He will continue to take medications as prescribed, to decrease high sodium intake, excessive alcohol intake, increase potassium intake, smoking cessation, and increase physical activity of at least 30 minutes of cardio activity daily. He will continue to follow Heart Healthy or DASH diet. - amLODipine (NORVASC) 5 MG tablet; Take 1 tablet (5 mg total) by mouth daily.  Dispense: 90 tablet; Refill: 3  2. Hyperthyroidism - methimazole (TAPAZOLE) 5 MG tablet; Take 1 tablet (5 mg total) by mouth 3 (three) times  daily.  Dispense: 90 tablet; Refill: 3  3. Dizziness  4. Enlarged prostate  5. BPH with elevated PSA Continue Tamsulosin as prescribed.  - tamsulosin (FLOMAX) 0.4 MG CAPS capsule; Take 1 capsule (0.4 mg total) by mouth daily.  Dispense: 90 capsule; Refill: 3  6. Follow up He will follow up in 6 months.   Meds ordered this encounter  Medications  . amLODipine (NORVASC) 5 MG tablet    Sig: Take 1 tablet (5 mg total) by mouth daily.    Dispense:  90 tablet    Refill:  3  . methimazole (TAPAZOLE) 5 MG tablet    Sig: Take 1 tablet (5 mg total) by mouth 3 (three) times daily.    Dispense:  90 tablet    Refill:  3  . tamsulosin (FLOMAX) 0.4 MG CAPS capsule    Sig: Take 1 capsule (0.4 mg total) by mouth daily.    Dispense:  90 capsule    Refill:  3    No orders of the defined types were placed in this encounter.   Referral Orders  No referral(s) requested today    Raliegh Ip,  MSN, FNP-BC [facility] [address] [place], [place] [address] [phone] fax   Problem List Items Addressed This Visit      Cardiovascular and Mediastinum   HTN (hypertension) - Primary   Relevant Medications   amLODipine (NORVASC) 5 MG tablet     Endocrine   Hyperthyroidism   Relevant Medications   methimazole (TAPAZOLE) 5 MG tablet     Other   Dizziness   Enlarged prostate    Other Visit Diagnoses    BPH with elevated PSA       Relevant Medications   tamsulosin (FLOMAX) 0.4 MG CAPS capsule  Follow up          Meds ordered this encounter  Medications  . amLODipine (NORVASC) 5 MG tablet    Sig: Take 1 tablet (5 mg total) by mouth daily.    Dispense:  90 tablet    Refill:  3  . methimazole (TAPAZOLE) 5 MG tablet    Sig: Take 1 tablet (5 mg total) by mouth 3 (three) times daily.    Dispense:  90 tablet    Refill:  3  . tamsulosin (FLOMAX) 0.4 MG CAPS capsule    Sig:  Take 1 capsule (0.4 mg total) by mouth daily.    Dispense:  90 capsule    Refill:  3    Follow-up: No follow-ups on file.    Kallie LocksNatalie M Aolani Piggott, FNP

## 2020-05-16 ENCOUNTER — Other Ambulatory Visit: Payer: Self-pay | Admitting: Family Medicine

## 2020-05-16 DIAGNOSIS — E0590 Thyrotoxicosis, unspecified without thyrotoxic crisis or storm: Secondary | ICD-10-CM

## 2020-05-18 ENCOUNTER — Telehealth: Payer: Self-pay

## 2020-05-18 NOTE — Telephone Encounter (Signed)
Noted  Discard the levothyroxine request

## 2020-05-18 NOTE — Telephone Encounter (Signed)
Pt was called @ 4:14 pm to see if he was still taken the Levothyroxine or Methimazole. Pt did not pick up the phone a message.Marland Kitchen

## 2020-09-07 ENCOUNTER — Telehealth: Payer: Self-pay | Admitting: Family Medicine

## 2020-09-07 NOTE — Telephone Encounter (Signed)
Pt was called concerning awv w/ pcc. lvm to return call  

## 2020-10-09 ENCOUNTER — Ambulatory Visit: Payer: Self-pay | Admitting: Family Medicine

## 2020-11-09 ENCOUNTER — Ambulatory Visit: Payer: Self-pay | Admitting: Family Medicine

## 2021-01-17 IMAGING — DX DG CHEST 1V PORT
1 series · 1 of 1 positions shown · non-contrast
Comparison: None.

CLINICAL DATA: 53-year-old male with a history of chills fatigue
and productive cough

EXAM:
PORTABLE CHEST 1 VIEW

[chest ap]
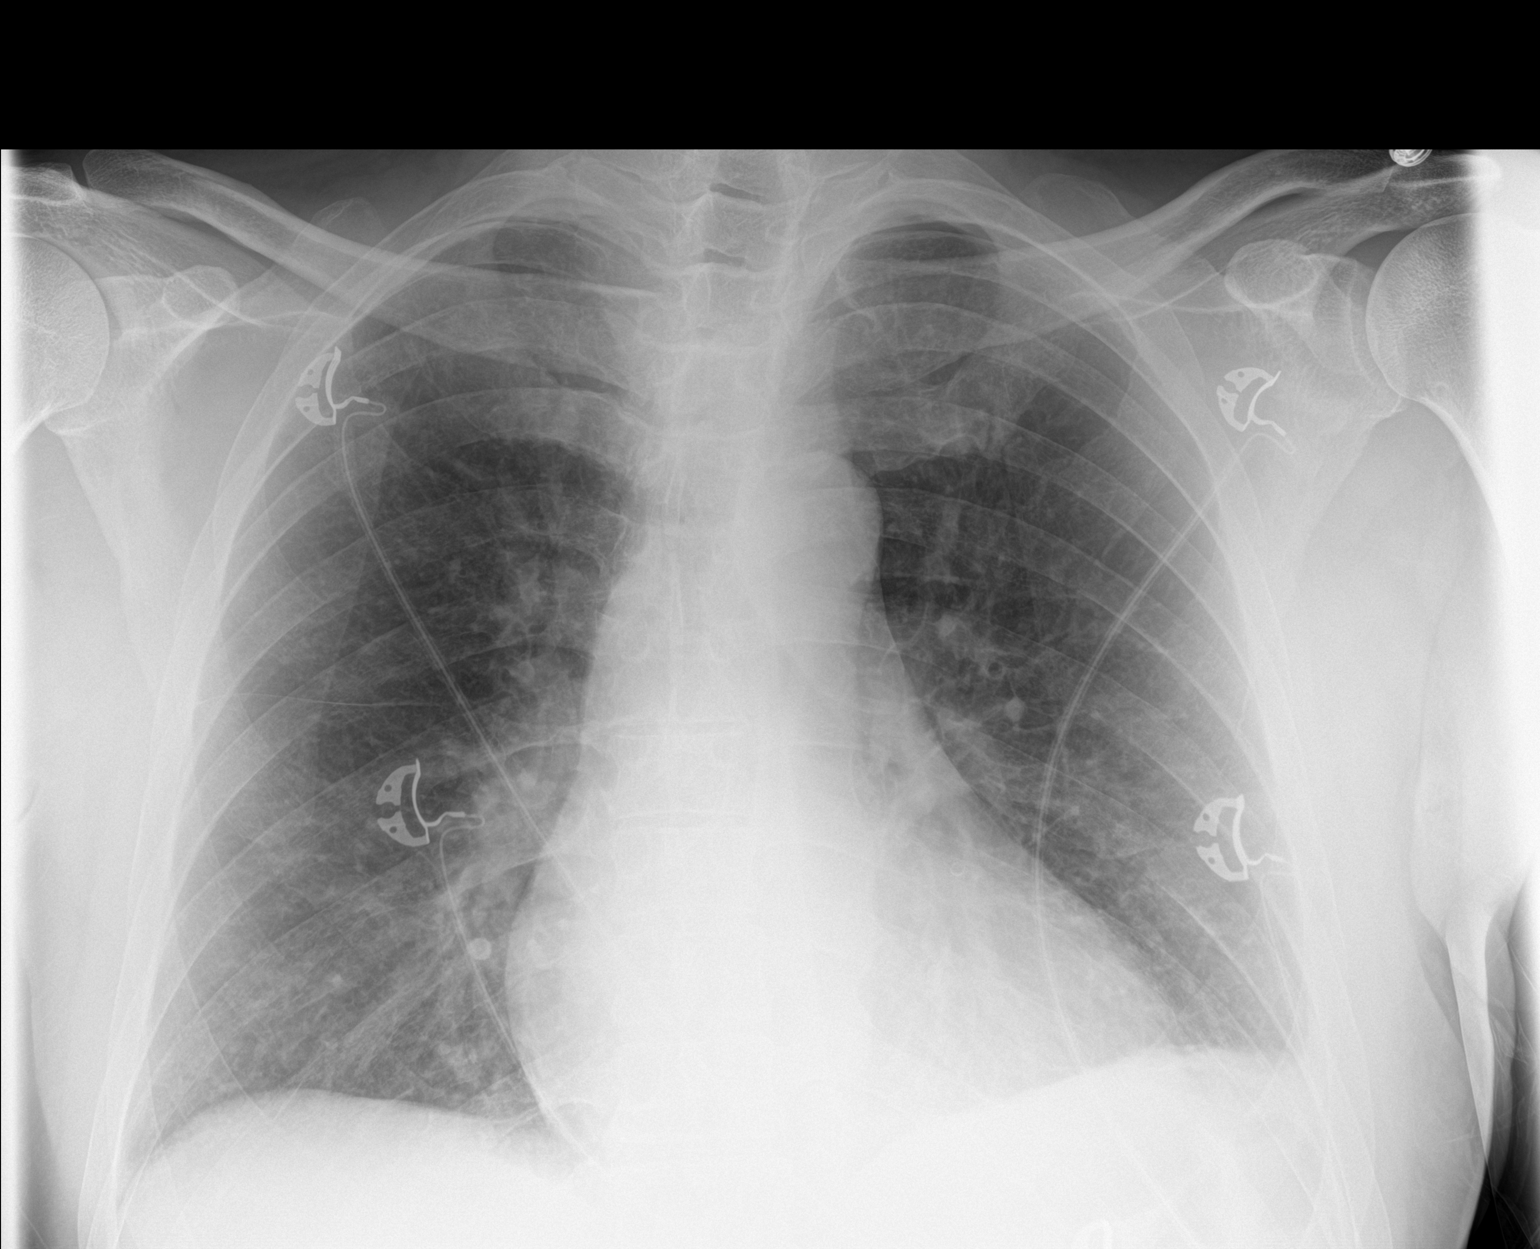

[1 of 1 positions shown; findings below may reference images not displayed]

FINDINGS: Cardiomediastinal silhouette within normal limits. No evidence of
central vascular congestion. No pneumothorax or pleural effusion.
Hazy opacities in the mid and lower lungs, with low lung volumes. No
displaced fracture
IMPRESSION: Vague opacities in the mid and lower lungs potentially atelectasis
versus early infection.

## 2021-02-12 IMAGING — CT CT ABD-PELV W/O CM
2 of 4 series · 16 of 46 positions shown, 18 images · non-contrast
Comparison: 12/22/2016

CLINICAL DATA: Abdominal pain for 1 year

EXAM:
CT ABDOMEN AND PELVIS WITHOUT CONTRAST
TECHNIQUE: Multidetector CT imaging of the abdomen and pelvis was performed
following the standard protocol without IV contrast.

[Series 2: axial st · axial · 0.74mm/px · z∈[-620,-190]mm · 13 of 96 slices shown, 15 images]
[im 5/96  soft-tissue]
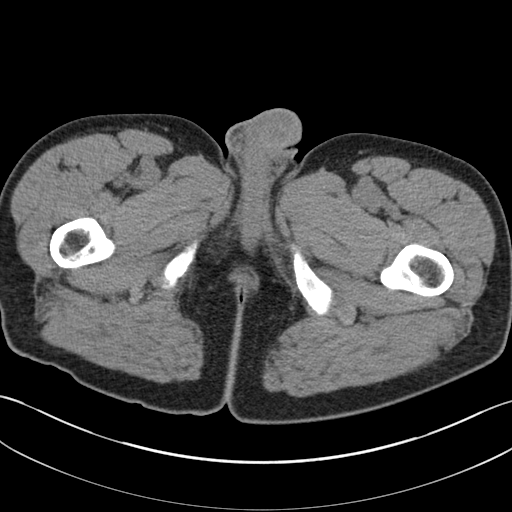
[im 5/96  bone]
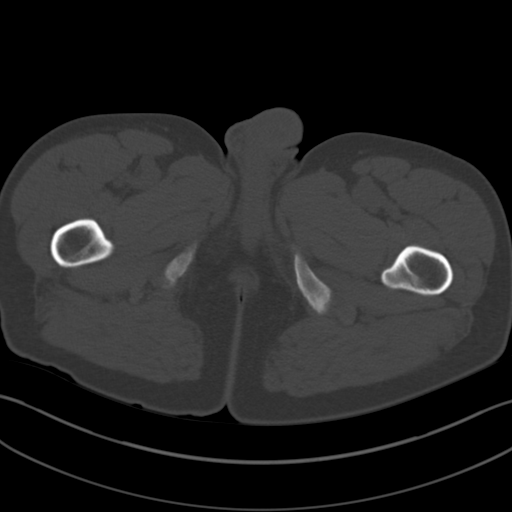
[im 14/96  soft-tissue]
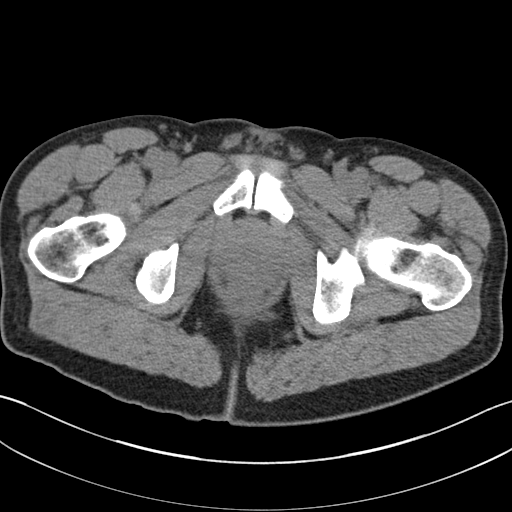
[im 19/96  soft-tissue]
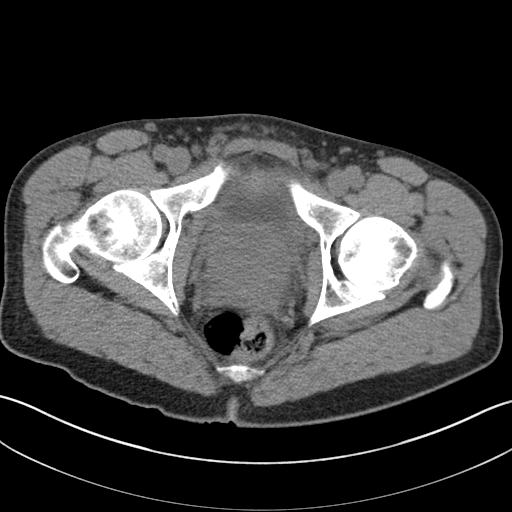
[im 28/96  soft-tissue]
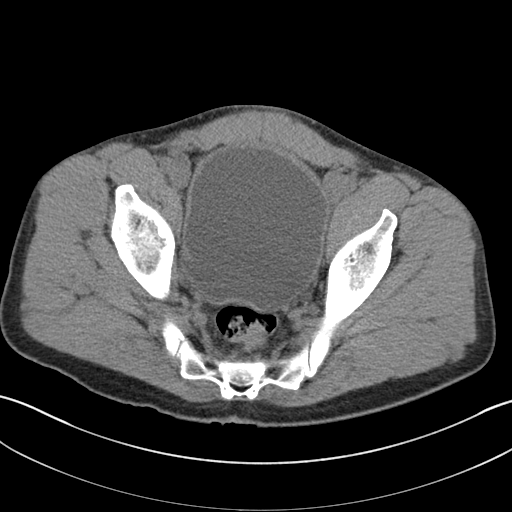
[im 32/96  soft-tissue]
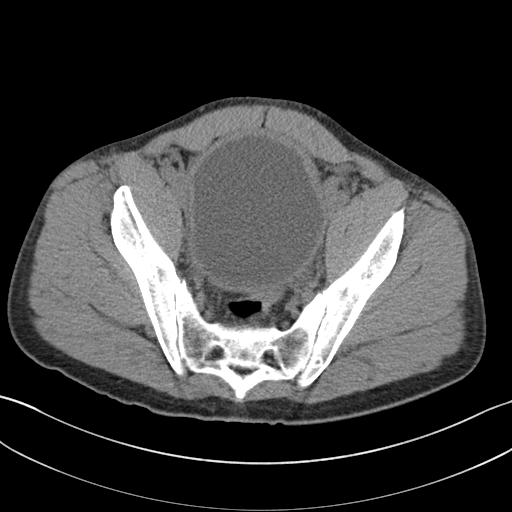
[im 41/96  soft-tissue]
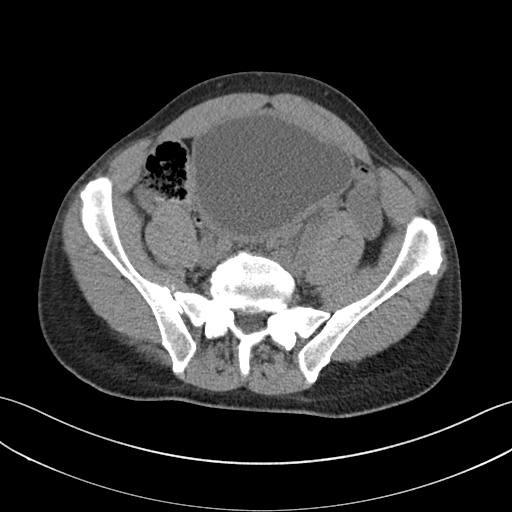
[im 50/96  soft-tissue]
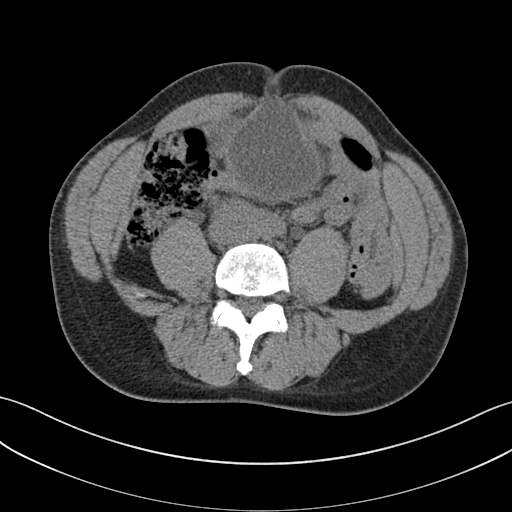
[im 55/96  soft-tissue]
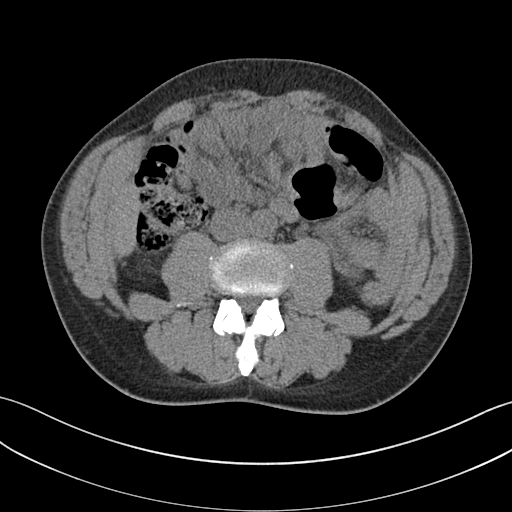
[im 64/96  soft-tissue]
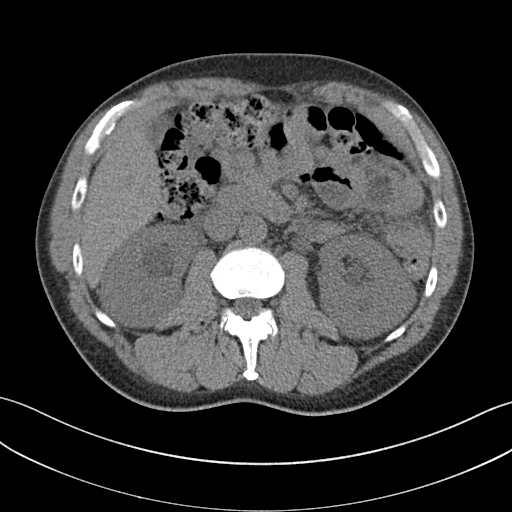
[im 64/96  bone]
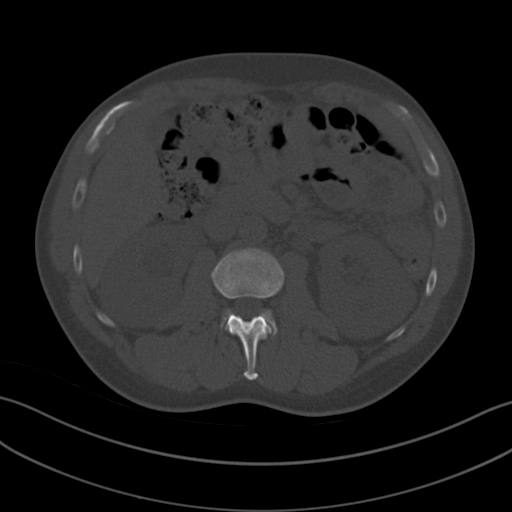
[im 68/96  soft-tissue]
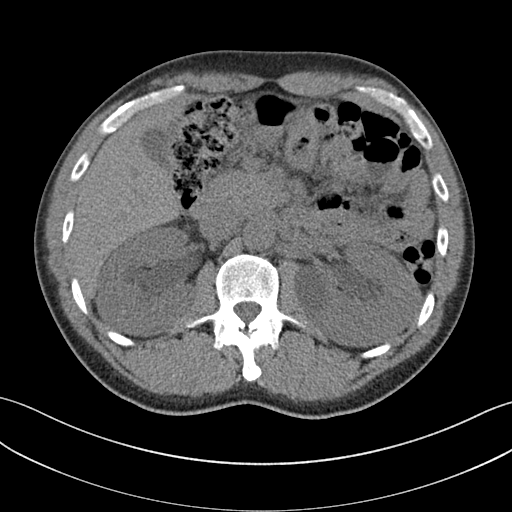
[im 77/96  soft-tissue]
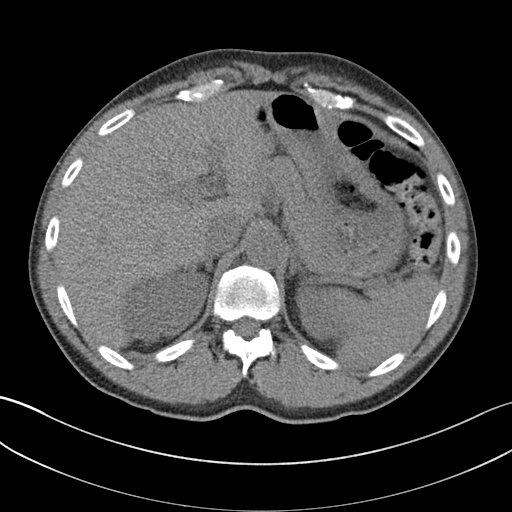
[im 82/96  soft-tissue]
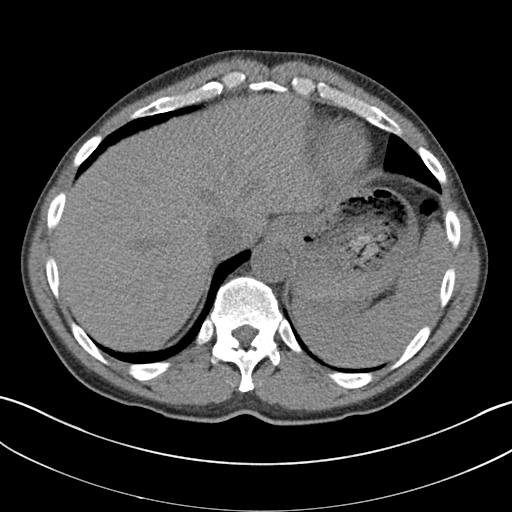
[im 91/96  soft-tissue]
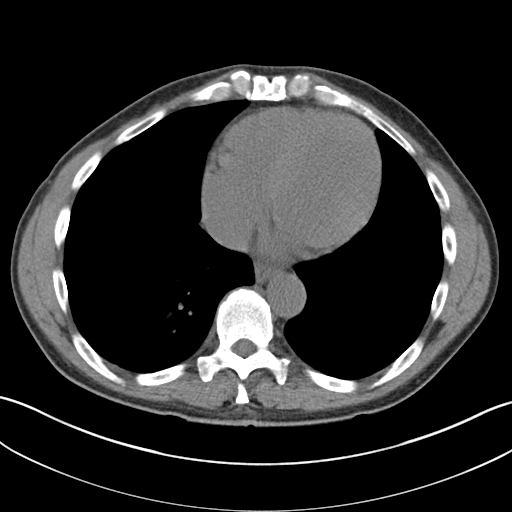

[Series 5: coronal st · coronal · 0.75mm/px · 3 of 134 slices shown]
[im 45/134  soft-tissue]
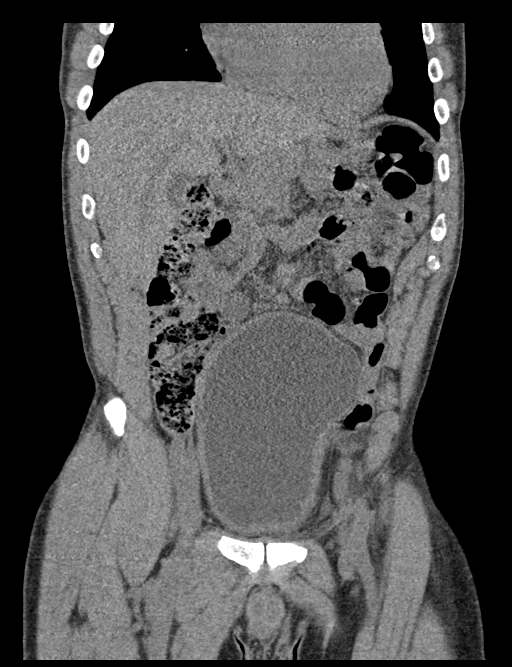
[im 60/134  soft-tissue]
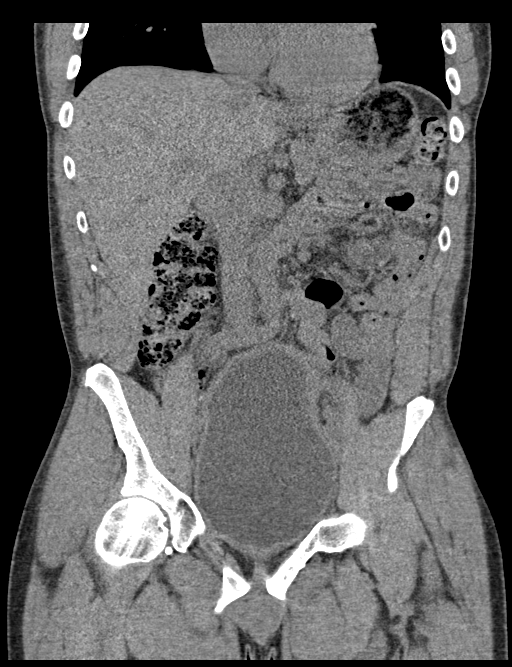
[im 74/134  soft-tissue]
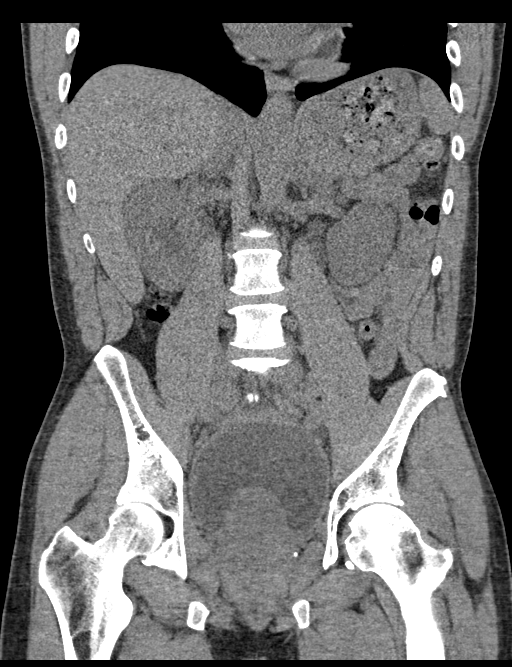

[16 of 46 positions shown; findings below may reference images not displayed]

FINDINGS: Lower chest: No acute abnormality.

Hepatobiliary: No focal liver abnormality identified. Unchanged tiny
calcification which is either within the gallbladder lumen or
immediately adjacent to the gallbladder. Gallbladder is not dilated.
No biliary dilatation.

Pancreas: Unremarkable. No pancreatic ductal dilatation or
surrounding inflammatory changes.

Spleen: Normal in size without focal abnormality.

Adrenals/Urinary Tract: Unremarkable adrenal glands. Unchanged
midpole cyst at the left kidney measuring approximately 3.1 cm. No
renal or ureteral calculi. Moderate bilateral hydronephrosis.
Urinary bladder is markedly distended with circumferentially
thickened walls.

Stomach/Bowel: Stomach is within normal limits. Appendix appears
normal. No evidence of bowel wall thickening, distention, or
inflammatory changes.

Vascular/Lymphatic: No significant vascular findings are present. No
enlarged abdominal or pelvic lymph nodes are identified.

Reproductive: Markedly enlarged prostate gland measuring
approximately 5.9 x 5.4 x 7.4 cm resulting in impress upon the
inferior aspect of the urinary bladder.

Other: Tiny fat containing umbilical hernia. No abdominopelvic
ascites.

Musculoskeletal: No acute or significant osseous findings.
Degenerative changes of the pubic symphysis, progressed from prior.
No lytic or sclerotic osseous lesions are identified.
IMPRESSION: 1. Markedly enlarged prostate gland resulting in impress upon the
inferior aspect of the urinary bladder. There is associated moderate
bilateral hydronephrosis. Correlation with serum PSA and urology
consultation are recommended.
2. Urinary bladder is markedly distended with circumferentially
thickened walls. Findings are suggestive of chronic bladder outlet
obstruction.
3. Degenerative changes of the pubic symphysis, progressed from
prior study.

## 2022-02-19 ENCOUNTER — Emergency Department (HOSPITAL_COMMUNITY)
Admission: EM | Admit: 2022-02-19 | Discharge: 2022-02-19 | Disposition: A | Discharge status: Home / Self Care | Payer: Self-pay | Attending: Emergency Medicine | Admitting: Emergency Medicine

## 2022-02-19 ENCOUNTER — Encounter (HOSPITAL_COMMUNITY): Payer: Self-pay | Admitting: *Deleted

## 2022-02-19 ENCOUNTER — Other Ambulatory Visit: Payer: Self-pay

## 2022-02-19 DIAGNOSIS — R339 Retention of urine, unspecified: Secondary | ICD-10-CM | POA: Insufficient documentation

## 2022-02-19 DIAGNOSIS — R310 Gross hematuria: Secondary | ICD-10-CM | POA: Insufficient documentation

## 2022-02-19 DIAGNOSIS — I10 Essential (primary) hypertension: Secondary | ICD-10-CM | POA: Insufficient documentation

## 2022-02-19 DIAGNOSIS — Z79899 Other long term (current) drug therapy: Secondary | ICD-10-CM | POA: Insufficient documentation

## 2022-02-19 LAB — URINALYSIS, MICROSCOPIC (REFLEX): RBC / HPF: >50 RBC/hpf (ref 0–5)

## 2022-02-19 LAB — URINALYSIS, ROUTINE W REFLEX MICROSCOPIC

## 2022-02-19 MED ORDER — CEPHALEXIN 500 MG PO CAPS: 500.0000 mg | ORAL_CAPSULE | Freq: Two times a day (BID) | ORAL | 0 refills | Status: AC

## 2022-02-19 NOTE — ED Triage Notes (Signed)
Pt has enlarged prostate, now has blood in urine similar to when he had his biopsy. ?

## 2022-02-19 NOTE — Discharge Instructions (Addendum)
I have prescribed you an antibiotic for discharge.  You need to keep the Foley catheter in place until you are seen by urology.  Please call them on Monday to have an appointment scheduled. ?

## 2022-02-19 NOTE — ED Provider Notes (Signed)
?[facility] DEPT ?Provider Note ? ? ?CSN: [id number] ?Arrival date & time: 02/19/22  1552 ? ?  ? ?History ?PMH: HTN, enlarged prostate, Hyperthyroidism ?Chief Complaint  ?Patient presents with  ? Hematuria  ? ? ?John Woods is a 56 y.o. male.  Patient presents with hematuria.  He states that he started noticing it yesterday, however today is worsened and he is having more blood clots.  Feels like he is having difficulty urinating.  Happen before but this was several years ago when he had a biopsy done on his prostate.  He ended up requiring a Foley catheter at that time.  He does endorse some suprapubic pressure.  Denies any dysuria.  Denies fevers ? ? ?Hematuria ? ? ?  ? ?Home Medications ?Prior to Admission medications   ?Medication Sig Start Date End Date Taking? Authorizing Provider  ?cephALEXin (KEFLEX) 500 MG capsule Take 1 capsule (500 mg total) by mouth 2 (two) times daily for 7 days. 02/19/22 02/26/22 Yes Amber Williard, Adora Fridge, PA-C  ?acetaminophen (TYLENOL) 500 MG tablet Take 500 mg by mouth every 6 (six) hours as needed for headache. ?Patient not taking: Reported on 05/08/2020    [provider]  ?amLODipine (NORVASC) 5 MG tablet Take 1 tablet (5 mg total) by mouth daily. 05/08/20   Azzie Glatter, FNP  ?meclizine (ANTIVERT) 25 MG tablet Take 1 tablet (25 mg total) by mouth 3 (three) times daily as needed for dizziness. ?Patient not taking: Reported on 05/08/2020 12/31/19   Azzie Glatter, FNP  ?methimazole (TAPAZOLE) 5 MG tablet Take 1 tablet (5 mg total) by mouth 3 (three) times daily. 05/08/20   Azzie Glatter, FNP  ?tamsulosin (FLOMAX) 0.4 MG CAPS capsule Take 1 capsule (0.4 mg total) by mouth daily. 05/08/20   Azzie Glatter, FNP  ?Vitamin D, Ergocalciferol, (DRISDOL) 1.25 MG (50000 UNIT) CAPS capsule Take 1 capsule (50,000 Units total) by mouth every 7 (seven) days. 01/07/20   Azzie Glatter, FNP  ?   ? ?Allergies    ?Patient has no known allergies.   ? ?Review  of Systems   ?Review of Systems  ?Genitourinary:  Positive for difficulty urinating and hematuria.  ?All other systems reviewed and are negative. ? ?Physical Exam ?Updated Vital Signs ?BP (!) 147/88   Pulse 86   Temp 98.6 ?F (37 ?C) (Oral)   Resp 18   Ht 6\' 3"  (1.905 m)   Wt 94.3 kg   SpO2 95%   BMI 26.00 kg/m?  ?Physical Exam ?Vitals and nursing note reviewed.  ?Constitutional:   ?   General: He is not in acute distress. ?   Appearance: Normal appearance. He is well-developed. He is not ill-appearing, toxic-appearing or diaphoretic.  ?HENT:  ?   Head: Normocephalic and atraumatic.  ?   Nose: No nasal deformity.  ?   Mouth/Throat:  ?   Lips: Pink. No lesions.  ?Eyes:  ?   General: Gaze aligned appropriately. No scleral icterus.    ?   Right eye: No discharge.     ?   Left eye: No discharge.  ?   Conjunctiva/sclera: Conjunctivae normal.  ?   Right eye: Right conjunctiva is not injected. No exudate or hemorrhage. ?   Left eye: Left conjunctiva is not injected. No exudate or hemorrhage. ?Pulmonary:  ?   Effort: Pulmonary effort is normal. No respiratory distress.  ?Abdominal:  ?   Comments: Mild tenderness overlying the suprapubic area.  No obvious  distention of the bladder  ?Skin: ?   General: Skin is warm and dry.  ?Neurological:  ?   Mental Status: He is alert and oriented to person, place, and time.  ?Psychiatric:     ?   Mood and Affect: Mood normal.     ?   Speech: Speech normal.     ?   Behavior: Behavior normal. Behavior is cooperative.  ? ? ?ED Results / Procedures / Treatments   ?Labs ?(all labs ordered are listed, but only abnormal results are displayed) ?Labs Reviewed  ?URINALYSIS, ROUTINE W REFLEX MICROSCOPIC - Abnormal; Notable for the following components:  ?    Result Value  ? Color, Urine RED (*)   ? APPearance TURBID (*)   ? Glucose, UA   (*)   ? Value: TEST NOT REPORTED DUE TO COLOR INTERFERENCE OF URINE PIGMENT  ? Hgb urine dipstick   (*)   ? Value: TEST NOT REPORTED DUE TO COLOR INTERFERENCE  OF URINE PIGMENT  ? Bilirubin Urine   (*)   ? Value: TEST NOT REPORTED DUE TO COLOR INTERFERENCE OF URINE PIGMENT  ? Ketones, ur   (*)   ? Value: TEST NOT REPORTED DUE TO COLOR INTERFERENCE OF URINE PIGMENT  ? Protein, ur   (*)   ? Value: TEST NOT REPORTED DUE TO COLOR INTERFERENCE OF URINE PIGMENT  ? Nitrite   (*)   ? Value: TEST NOT REPORTED DUE TO COLOR INTERFERENCE OF URINE PIGMENT  ? Leukocytes,Ua   (*)   ? Value: TEST NOT REPORTED DUE TO COLOR INTERFERENCE OF URINE PIGMENT  ? All other components within normal limits  ?URINALYSIS, MICROSCOPIC (REFLEX) - Abnormal; Notable for the following components:  ? Bacteria, UA FIELD OBSCURED BY RBC'S (*)   ? All other components within normal limits  ?URINE CULTURE  ? ? ?EKG ?None ? ?Radiology ?No results found. ? ?Procedures ?Procedures  ? ?Medications Ordered in ED ?Medications - No data to display ? ?ED Course/ Medical Decision Making/ A&P ?  ?                        ?Medical Decision Making ?Amount and/or Complexity of Data Reviewed ?Labs: ordered. ? ?Risk ?Prescription drug management. ? ? ?This patient presents with hematuria for 1 day now.  He is starting to develop blood clots and feels like he is having difficulty urinating.  We will obtain a bladder scan here.  Urinalysis was reviewed, however is not very helpful given that there is so much blood in the urine.  A culture was sent out.  Patient has no other symptoms.  Do not think we require labs at this time. ? ?Bladder scan revealed 150 cc postvoid residual. He does not want foley placement. Will treat ppx with keflex.  He will need to follow-up closely with [facility].  He can schedule appointment on Monday. ? ?Final Clinical Impression(s) / ED Diagnoses ?Final diagnoses:  ?Gross hematuria  ?Urinary retention  ? ? ?Rx / DC Orders ?ED Discharge Orders   ? ?      Ordered  ?  cephALEXin (KEFLEX) 500 MG capsule  2 times daily       ? 02/19/22 2116  ? ?  ?  ? ?  ? ? ?  ?Adolphus Birchwood, PA-C ?02/19/22  2141 ? ?  ?Godfrey Pick, MD ?02/25/22 1524 ? ?

## 2022-02-21 LAB — URINE CULTURE: Culture: NO GROWTH

## 2022-08-05 ENCOUNTER — Other Ambulatory Visit: Payer: Self-pay

## 2022-08-05 ENCOUNTER — Encounter (HOSPITAL_COMMUNITY): Payer: Self-pay

## 2022-08-05 ENCOUNTER — Emergency Department (HOSPITAL_COMMUNITY)
Admission: EM | Admit: 2022-08-05 | Discharge: 2022-08-05 | Disposition: A | Discharge status: Home / Self Care | Payer: Self-pay | Attending: Emergency Medicine | Admitting: Emergency Medicine

## 2022-08-05 DIAGNOSIS — L02811 Cutaneous abscess of head [any part, except face]: Secondary | ICD-10-CM | POA: Insufficient documentation

## 2022-08-05 DIAGNOSIS — L089 Local infection of the skin and subcutaneous tissue, unspecified: Secondary | ICD-10-CM

## 2022-08-05 DIAGNOSIS — Z79899 Other long term (current) drug therapy: Secondary | ICD-10-CM | POA: Insufficient documentation

## 2022-08-05 DIAGNOSIS — L720 Epidermal cyst: Secondary | ICD-10-CM | POA: Insufficient documentation

## 2022-08-05 DIAGNOSIS — Z23 Encounter for immunization: Secondary | ICD-10-CM | POA: Insufficient documentation

## 2022-08-05 MED ORDER — SULFAMETHOXAZOLE-TRIMETHOPRIM 800-160 MG PO TABS: 1.0000 | ORAL_TABLET | Freq: Two times a day (BID) | ORAL | 0 refills | Status: AC

## 2022-08-05 MED ORDER — TETANUS-DIPHTH-ACELL PERTUSSIS 5-2.5-18.5 LF-MCG/0.5 IM SUSY
0.5000 mL | PREFILLED_SYRINGE | Freq: Once | INTRAMUSCULAR | Status: AC
  Administered 2022-08-05: 0.5 mL via INTRAMUSCULAR
  Filled 2022-08-05: qty 0.5

## 2022-08-05 MED ORDER — IBUPROFEN 600 MG PO TABS: 600.0000 mg | ORAL_TABLET | Freq: Four times a day (QID) | ORAL | 0 refills | Status: AC | PRN

## 2022-08-05 MED ORDER — LIDOCAINE-EPINEPHRINE (PF) 2 %-1:200000 IJ SOLN
10.0000 mL | Freq: Once | INTRAMUSCULAR | Status: AC
  Administered 2022-08-05: 10 mL via INTRADERMAL
  Filled 2022-08-05: qty 20

## 2022-08-05 NOTE — ED Provider Notes (Signed)
[facility] DEPT Provider Note   CSN: [id number] Arrival date & time: 08/05/22  1346     History  Chief Complaint  Patient presents with   Abscess    John Woods is a 56 y.o. male.  The history is provided by the patient and medical records. No language interpreter was used.  Abscess    56 year old male presenting with complaints of abscess to the back of his scalp.  Patient states he has a lump to the back of his head for "quite a while".  However it begins to become more painful since last night prompting this ER visit.  He does not endorse any fever no recent trauma no headache.  He is not up-to-date with tetanus.  He denies any specific treatment tried.  Home Medications Prior to Admission medications   Medication Sig Start Date End Date Taking? Authorizing Provider  acetaminophen (TYLENOL) 500 MG tablet Take 500 mg by mouth every 6 (six) hours as needed for headache. Patient not taking: Reported on 05/08/2020    [provider]  amLODipine (NORVASC) 5 MG tablet Take 1 tablet (5 mg total) by mouth daily. 05/08/20   Azzie Glatter, FNP  meclizine (ANTIVERT) 25 MG tablet Take 1 tablet (25 mg total) by mouth 3 (three) times daily as needed for dizziness. Patient not taking: Reported on 05/08/2020 12/31/19   Azzie Glatter, FNP  methimazole (TAPAZOLE) 5 MG tablet Take 1 tablet (5 mg total) by mouth 3 (three) times daily. 05/08/20   Azzie Glatter, FNP  tamsulosin (FLOMAX) 0.4 MG CAPS capsule Take 1 capsule (0.4 mg total) by mouth daily. 05/08/20   Azzie Glatter, FNP  Vitamin D, Ergocalciferol, (DRISDOL) 1.25 MG (50000 UNIT) CAPS capsule Take 1 capsule (50,000 Units total) by mouth every 7 (seven) days. 01/07/20   Azzie Glatter, FNP      Allergies    Patient has no known allergies.    Review of Systems   Review of Systems  All other systems reviewed and are negative.   Physical Exam Updated Vital Signs BP 137/79   Pulse  (!) 58   Temp 98.2 F (36.8 C) (Oral)   Resp 18   Ht 6\' 3"  (1.905 m)   Wt 93 kg   SpO2 99%   BMI 25.62 kg/m  Physical Exam Vitals and nursing note reviewed.  Constitutional:      General: He is not in acute distress.    Appearance: He is well-developed.  HENT:     Head: Atraumatic.     Comments: Scalp: Noted to the occipital scalp there is an area of induration fluctuant approximately 2 cm in diameter with some mild surrounding skin erythema and tenderness to palpation.  Evidence of overlying hair loss to the affected area. Eyes:     Conjunctiva/sclera: Conjunctivae normal.  Musculoskeletal:     Cervical back: Neck supple.  Skin:    Findings: No rash.  Neurological:     Mental Status: He is alert.     ED Results / Procedures / Treatments   Labs (all labs ordered are listed, but only abnormal results are displayed) Labs Reviewed - No data to display  EKG None  Radiology No results found.  Procedures .Marland KitchenIncision and Drainage  Date/Time: 08/05/2022 4:34 PM  Performed by: Domenic Moras, PA-C Authorized by: Domenic Moras, PA-C   Consent:    Consent obtained:  Verbal   Consent given by:  Patient   Risks discussed:  Bleeding, incomplete drainage, pain and damage to other organs   Alternatives discussed:  No treatment Universal protocol:    Procedure explained and questions answered to patient or proxy's satisfaction: yes     Relevant documents present and verified: yes     Test results available : yes     Imaging studies available: yes     Required blood products, implants, devices, and special equipment available: yes     Site/side marked: yes     Immediately prior to procedure, a time out was called: yes     Patient identity confirmed:  Verbally with patient Location:    Type:  Cyst   Size:  2   Location:  Head   Head location:  Scalp Pre-procedure details:    Skin preparation:  Betadine Anesthesia:    Anesthesia method:  Local infiltration   Local  anesthetic:  Lidocaine 2% WITH epi Procedure type:    Complexity:  Simple Procedure details:    Incision types:  Single straight   Incision depth:  Subcutaneous   Wound management:  Probed and deloculated, irrigated with saline and extensive cleaning   Drainage:  Purulent and bloody   Drainage amount:  Scant   Packing materials:  None Post-procedure details:    Procedure completion:  Tolerated well, no immediate complications     Medications Ordered in ED Medications  Tdap (BOOSTRIX) injection 0.5 mL (has no administration in time range)  lidocaine-EPINEPHrine (XYLOCAINE W/EPI) 2 %-1:200000 (PF) injection 10 mL (has no administration in time range)    ED Course/ Medical Decision Making/ A&P                           Medical Decision Making Risk Prescription drug management.   BP 137/79   Pulse (!) 58   Temp 98.2 F (36.8 C) (Oral)   Resp 18   Ht 6\' 3"  (1.905 m)   Wt 93 kg   SpO2 99%   BMI 25.62 kg/m   63:64 PM 56 year old male presenting with complaints of abscess to the back of his scalp.  Patient states he has a lump to the back of his head for "quite a while".  However it begins to become more painful since last night prompting this ER visit.  He does not endorse any fever no recent trauma no headache.  He is not up-to-date with tetanus.  He denies any specific treatment tried.  On exam there is an area of induration and fluctuance noted to the occipital scalp suggestive of likely an infected epidermoid cyst.  I have considered kerion, malignancy, and hematoma on my differential but felt that is less likely.  Will perform incision and drainage.  We will update tetanus.  Patient does not have any systemic manifestation of his condition.  4:34 PM Incision and drainage performed by me with minimal purulent discharge and foul odor.  Finding is consistent with infected epidermoid cyst.  Dressing placed.  Appropriate wound care instruction provided.  Return precaution given.   We will also prescribe antibiotic.  I have considered advanced imaging, and labs but in the setting of a benign appearing cyst without any systemic manifestation I felt the appropriate treatment have been performed.  Social determinant health including tobacco use, recommend tobacco cessation.  Patient discharged home with Bactrim and ibuprofen.        Final Clinical Impression(s) / ED Diagnoses Final diagnoses:  Infected epidermoid cyst    Rx / DC  Orders ED Discharge Orders          Ordered    sulfamethoxazole-trimethoprim (BACTRIM DS) 800-160 MG tablet  2 times daily        08/05/22 1636    ibuprofen (ADVIL) 600 MG tablet  Every 6 hours PRN        08/05/22 1637              Fayrene Helper, PA-C 08/05/22 1637    Jacalyn Lefevre, MD 08/06/22 1513

## 2022-08-05 NOTE — ED Triage Notes (Signed)
Patient reports an abscess to the back of his hed x 1 week and worse yesterday

## 2022-08-05 NOTE — ED Notes (Signed)
An After Visit Summary was printed and given to the patient. Discharge instructions given and no further questions at this time.  Pt has clean and dry dressing on applied by provider.

## 2022-08-05 NOTE — ED Provider Triage Note (Signed)
Emergency Medicine Provider Triage Evaluation Note  John Woods , a 56 y.o. male  was evaluated in triage.  Pt complains of abscess to posterior head x1 week. No fever. History of same requiring I&D  Review of Systems  Positive: wound Negative: fever  Physical Exam  BP (!) 161/106 (BP Location: Left Arm)   Pulse 64   Temp 98.2 F (36.8 C) (Oral)   Resp 16   Ht 6\' 3"  (1.905 m)   Wt 93 kg   SpO2 100%   BMI 25.62 kg/m  Gen:   Awake, no distress   Resp:  Normal effort  MSK:   Moves extremities without difficulty  Other:  Abscess to posterior head  Medical Decision Making  Medically screening exam initiated at 2:36 PM.  Appropriate orders placed.  Gerilyn Nestle was informed that the remainder of the evaluation will be completed by another provider, this initial triage assessment does not replace that evaluation, and the importance of remaining in the ED until their evaluation is complete.     Suzy Bouchard, [place] 08/05/22 1437

## 2022-08-30 ENCOUNTER — Ambulatory Visit: Payer: Self-pay | Admitting: *Deleted

## 2022-08-30 ENCOUNTER — Other Ambulatory Visit: Payer: Self-pay

## 2022-08-30 ENCOUNTER — Encounter (HOSPITAL_COMMUNITY): Payer: Self-pay

## 2022-08-30 ENCOUNTER — Emergency Department (HOSPITAL_COMMUNITY)
Admission: EM | Admit: 2022-08-30 | Discharge: 2022-08-30 | Disposition: A | Discharge status: Home / Self Care | Payer: Self-pay | Attending: Emergency Medicine | Admitting: Emergency Medicine

## 2022-08-30 DIAGNOSIS — Z79899 Other long term (current) drug therapy: Secondary | ICD-10-CM | POA: Insufficient documentation

## 2022-08-30 DIAGNOSIS — I10 Essential (primary) hypertension: Secondary | ICD-10-CM | POA: Insufficient documentation

## 2022-08-30 DIAGNOSIS — K029 Dental caries, unspecified: Secondary | ICD-10-CM | POA: Insufficient documentation

## 2022-08-30 DIAGNOSIS — R220 Localized swelling, mass and lump, head: Secondary | ICD-10-CM

## 2022-08-30 MED ORDER — AMOXICILLIN-POT CLAVULANATE 875-125 MG PO TABS: 1.0000 | ORAL_TABLET | Freq: Two times a day (BID) | ORAL | 0 refills | Status: AC

## 2022-08-30 NOTE — Discharge Instructions (Addendum)
You were seen in the ER for evaluation of your dental pain. I have included information in your discharge paperwork of resources. Please make sure to call to schedule an appointment. You can take Tylenol or ibuprofen as needed for pain. You should feel better with in the next few days. If you are still experiencing pain and swelling after 72 hours, please return tot he ER for further imaging. If you have any concerns, new or worsening symptoms, please return to the ER for re-evaluation.   Contact a dentist if: You have dental pain and you do not know why. Medicine does not help your pain. Your symptoms get worse. You have new symptoms. Get help right away if: You cannot open your mouth. You are having trouble breathing or swallowing. You have a fever. Your face, neck, or jaw is swollen. These symptoms may be an emergency. Get help right away. Call your local emergency services (911 in the [place] Do not wait to see if the symptoms will go away. Do not drive yourself to the hospital.

## 2022-08-30 NOTE — Telephone Encounter (Signed)
Reason for Disposition  Face is very swollen  Answer Assessment - Initial Assessment Questions 1. LOCATION: "Which tooth is hurting?"  (e.g., right-side/left-side, upper/lower, front/back)     Lower- right gum 2. ONSET: "When did the toothache start?"  (e.g., hours, days)      2-3 weeks 3. SEVERITY: "How bad is the toothache?"  (Scale 1-10; mild, moderate or severe)   - MILD (1-3): doesn't interfere with chewing    - MODERATE (4-7): interferes with chewing, interferes with normal activities, awakens from sleep     - SEVERE (8-10): unable to eat, unable to do any normal activities, excruciating pain        moderate 4. SWELLING: "Is there any visible swelling of your face?"     yes 5. OTHER SYMPTOMS: "Do you have any other symptoms?" (e.g., fever)     no  Protocols used: Toothache-A-AH

## 2022-08-30 NOTE — ED Provider Notes (Signed)
[facility]-EMERGENCY DEPT Provider Note   CSN: [id number] Arrival date & time: 08/30/22  1415     History Chief Complaint  Patient presents with   Facial Swelling    John Woods is a 56 y.o. male with h/o hyperthyroidism, HTN, dental disease, presents to the ER for evaluation of right sided jaw swelling for the past two weeks. He reports that is was initially painful, but has since improved.  He was concerned as the swelling has still remained.  He is still able to eat and drink.  Denies any fevers.  Denies any neck pain.  He has not taken any pain medication prior to arrival.  HPI     Home Medications Prior to Admission medications   Medication Sig Start Date End Date Taking? Authorizing Provider  acetaminophen (TYLENOL) 500 MG tablet Take 500 mg by mouth every 6 (six) hours as needed for headache. Patient not taking: Reported on 05/08/2020    [provider]  amLODipine (NORVASC) 5 MG tablet Take 1 tablet (5 mg total) by mouth daily. 05/08/20   Kallie Locks, FNP  ibuprofen (ADVIL) 600 MG tablet Take 1 tablet (600 mg total) by mouth every 6 (six) hours as needed. 08/05/22   Fayrene Helper, PA-C  meclizine (ANTIVERT) 25 MG tablet Take 1 tablet (25 mg total) by mouth 3 (three) times daily as needed for dizziness. Patient not taking: Reported on 05/08/2020 12/31/19   Kallie Locks, FNP  methimazole (TAPAZOLE) 5 MG tablet Take 1 tablet (5 mg total) by mouth 3 (three) times daily. 05/08/20   Kallie Locks, FNP  tamsulosin (FLOMAX) 0.4 MG CAPS capsule Take 1 capsule (0.4 mg total) by mouth daily. 05/08/20   Kallie Locks, FNP  Vitamin D, Ergocalciferol, (DRISDOL) 1.25 MG (50000 UNIT) CAPS capsule Take 1 capsule (50,000 Units total) by mouth every 7 (seven) days. 01/07/20   Kallie Locks, FNP      Allergies    Patient has no known allergies.    Review of Systems   Review of Systems  Constitutional:  Negative for chills and fever.  HENT:   Positive for dental problem and facial swelling. Negative for rhinorrhea, sore throat and trouble swallowing.   Respiratory:  Negative for shortness of breath.   Cardiovascular:  Negative for chest pain.    Physical Exam Updated Vital Signs BP 137/82 (BP Location: Right Arm)   Pulse 91   Temp 98.6 F (37 C) (Oral)   Resp 16   Ht 6\' 3"  (1.905 m)   Wt 93 kg   SpO2 97%   BMI 25.62 kg/m  Physical Exam Vitals and nursing note reviewed.  Constitutional:      Appearance: Normal appearance.  HENT:     Mouth/Throat:     Mouth: Mucous membranes are moist.     Comments: Moist mucous membranes.  No pharyngeal erythema, edema, or exudate noted.  Patient has poor dentition noted throughout with multiple missing teeth.  No drainable fluid noted.  Patient does have some swelling noted to the bottom right mandible and the mucosal area.  No trismus noted.  Patient has greater than 3 fingerbreadths wide spaced.  Uvula midline.  Airway patent.  Patient controlling on secretions.  Speaking with normal speech. Eyes:     General: No scleral icterus. Neck:     Comments: No swelling noted Pulmonary:     Effort: Pulmonary effort is normal. No respiratory distress.  Musculoskeletal:     Cervical  back: Normal range of motion. No tenderness.  Lymphadenopathy:     Cervical: No cervical adenopathy.  Skin:    General: Skin is dry.     Findings: No rash.  Neurological:     General: No focal deficit present.     Mental Status: He is alert. Mental status is at baseline.  Psychiatric:        Mood and Affect: Mood normal.     ED Results / Procedures / Treatments   Labs (all labs ordered are listed, but only abnormal results are displayed) Labs Reviewed - No data to display  EKG None  Radiology No results found.  Procedures Procedures   Medications Ordered in ED Medications - No data to display  ED Course/ Medical Decision Making/ A&P                           Medical Decision  Making Risk Prescription drug management.   56 year old male presents the emergency room for evaluation of facial swelling and dental caries.  Differential diagnosis includes but not limited to deep space infection versus dental caries versus dental abscess.  Vital signs show temperature mildly decreased at 97.5 otherwise normotensive, normal pulse rate, satting well on room air without any increased work of breathing.  Exam as noted above.  I do not see any signs of deep space infection.  Patient does have some swelling noted to the right lower jaw, however no drainable abscess.  He has no sublingual edema.  No signs of any Ludwick's.  He is speaking in normal sentences controlling his own secretions.  He has full ROM of the neck. No facial crepitus or overlying erythema. This is likely a dental infection causing some swelling.  This vital signs not consistent with any sepsis.  My attending assessed at bedside and recommended outpatient antibiotics with dentist referral.  If not improved in the next few days, to return to the emergency department for CT scan.  Discharged the patient home on some Augmentin.  We discussed return precautions red flag symptoms.  Patient verbalizes understanding and agrees the plan.  Patient is stable being discharged home in good condition.  I discussed this case with my attending physician who cosigned this note including patient's presenting symptoms, physical exam, and planned diagnostics and interventions. Attending physician stated agreement with plan or made changes to plan which were implemented.   Attending physician assessed patient at bedside.   Final Clinical Impression(s) / ED Diagnoses Final diagnoses:  Facial swelling  Dental caries    Rx / DC Orders ED Discharge Orders          Ordered    amoxicillin-clavulanate (AUGMENTIN) 875-125 MG tablet  Every 12 hours        08/30/22 1546              Sherrell Puller, PA-C 09/02/22 0134    Drenda Freeze, MD 09/03/22 1452

## 2022-08-30 NOTE — Telephone Encounter (Signed)
  Chief Complaint: gum abscess Symptoms: large swelling lower R gum Frequency: 2-3 weeks- swelling got larger over night Pertinent Negatives: Patient denies fever,pain Disposition: [x] ED /[] Urgent Care (no appt availability in office) / [] Appointment(In office/virtual)/ []  [facility]/ [] Home Care/ [] Refused Recommended Disposition /[] [facility]/ []  Follow-up with PCP Additional Notes:   Advised ED- patient does not have dentist and may need specialist

## 2022-08-30 NOTE — ED Notes (Signed)
Pt in chair, pt denies pain, pt states that he is ready to go home, pt verbalized understanding d/c and follow up, pt ambulatory from dpt

## 2022-08-30 NOTE — ED Triage Notes (Signed)
Pt to er, pt states that he is here for some swelling to his R jaw, states that he has a broken tooth on the R lower.  Pt states that it started last night and today it is worse.
